# Patient Record
Sex: Female | Born: 1935 | Race: White | Hispanic: No | Marital: Married | Smoking: Never smoker
Health system: Southern US, Community
[De-identification: ages and names within clinical notes are randomized; demographics above are authoritative.]

## PROBLEM LIST (undated history)

## (undated) DIAGNOSIS — Z860100 Personal history of colon polyps, unspecified: Secondary | ICD-10-CM

## (undated) DIAGNOSIS — E559 Vitamin D deficiency, unspecified: Secondary | ICD-10-CM

## (undated) DIAGNOSIS — B269 Mumps without complication: Secondary | ICD-10-CM

## (undated) DIAGNOSIS — R252 Cramp and spasm: Secondary | ICD-10-CM

## (undated) DIAGNOSIS — B019 Varicella without complication: Secondary | ICD-10-CM

## (undated) DIAGNOSIS — I10 Essential (primary) hypertension: Secondary | ICD-10-CM

## (undated) DIAGNOSIS — E119 Type 2 diabetes mellitus without complications: Secondary | ICD-10-CM

## (undated) DIAGNOSIS — E039 Hypothyroidism, unspecified: Secondary | ICD-10-CM

## (undated) DIAGNOSIS — L989 Disorder of the skin and subcutaneous tissue, unspecified: Secondary | ICD-10-CM

## (undated) DIAGNOSIS — M810 Age-related osteoporosis without current pathological fracture: Secondary | ICD-10-CM

## (undated) DIAGNOSIS — E7800 Pure hypercholesterolemia, unspecified: Secondary | ICD-10-CM

## (undated) DIAGNOSIS — B059 Measles without complication: Secondary | ICD-10-CM

## (undated) HISTORY — DX: Personal history of colon polyps, unspecified: Z86.0100

## (undated) HISTORY — PX: NECK SURGERY: SHX720

## (undated) HISTORY — DX: Type 2 diabetes mellitus without complications: E11.9

## (undated) HISTORY — DX: Hypercalcemia: E83.52

## (undated) HISTORY — PX: THYROID CYST EXCISION: SHX2511

## (undated) HISTORY — DX: Vitamin D deficiency, unspecified: E55.9

## (undated) HISTORY — DX: Measles without complication: B05.9

## (undated) HISTORY — DX: Cramp and spasm: R25.2

## (undated) HISTORY — DX: Varicella without complication: B01.9

## (undated) HISTORY — DX: Essential (primary) hypertension: I10

## (undated) HISTORY — PX: CHOLECYSTECTOMY: SHX55

## (undated) HISTORY — DX: Age-related osteoporosis without current pathological fracture: M81.0

## (undated) HISTORY — DX: Personal history of colonic polyps: Z86.010

## (undated) HISTORY — DX: Mumps without complication: B26.9

## (undated) HISTORY — DX: Hypothyroidism, unspecified: E03.9

## (undated) HISTORY — DX: Disorder of the skin and subcutaneous tissue, unspecified: L98.9

## (undated) HISTORY — DX: Pure hypercholesterolemia, unspecified: E78.00

---

## 1978-03-04 HISTORY — PX: ABDOMINAL HYSTERECTOMY: SHX81

## 1979-03-05 HISTORY — PX: HERNIA REPAIR: SHX51

## 2004-04-24 ENCOUNTER — Ambulatory Visit: Payer: Self-pay

## 2004-11-25 ENCOUNTER — Emergency Department: Payer: Self-pay | Admitting: Emergency Medicine

## 2005-06-27 ENCOUNTER — Ambulatory Visit: Payer: Self-pay

## 2006-07-24 ENCOUNTER — Ambulatory Visit: Payer: Self-pay

## 2007-06-02 ENCOUNTER — Ambulatory Visit: Payer: Self-pay | Admitting: Ophthalmology

## 2007-06-02 ENCOUNTER — Other Ambulatory Visit: Payer: Self-pay

## 2007-06-15 ENCOUNTER — Ambulatory Visit: Payer: Self-pay | Admitting: Ophthalmology

## 2007-06-30 LAB — HM PAP SMEAR: HM Pap smear: NORMAL

## 2007-07-27 ENCOUNTER — Ambulatory Visit: Payer: Self-pay | Admitting: Gastroenterology

## 2007-07-27 LAB — HM COLONOSCOPY

## 2007-07-28 ENCOUNTER — Ambulatory Visit: Payer: Self-pay | Admitting: Family Medicine

## 2007-09-24 ENCOUNTER — Ambulatory Visit: Payer: Self-pay | Admitting: Family Medicine

## 2008-06-08 ENCOUNTER — Ambulatory Visit: Payer: Self-pay | Admitting: Family Medicine

## 2008-07-29 ENCOUNTER — Ambulatory Visit: Payer: Self-pay | Admitting: Family Medicine

## 2010-01-19 ENCOUNTER — Encounter: Admission: RE | Admit: 2010-01-19 | Discharge: 2010-01-19 | Payer: Self-pay | Admitting: Family Medicine

## 2010-02-01 HISTORY — PX: OTHER SURGICAL HISTORY: SHX169

## 2010-02-28 ENCOUNTER — Encounter
Admission: RE | Admit: 2010-02-28 | Discharge: 2010-02-28 | Payer: Self-pay | Source: Home / Self Care | Attending: General Surgery | Admitting: General Surgery

## 2010-03-01 ENCOUNTER — Ambulatory Visit
Admission: RE | Admit: 2010-03-01 | Discharge: 2010-03-01 | Payer: Self-pay | Source: Home / Self Care | Attending: General Surgery | Admitting: General Surgery

## 2010-05-14 LAB — DIFFERENTIAL
Eosinophils Absolute: 0.1 10*3/uL (ref 0.0–0.7)
Eosinophils Relative: 1 % (ref 0–5)
Lymphocytes Relative: 30 % (ref 12–46)
Lymphs Abs: 3 10*3/uL (ref 0.7–4.0)
Monocytes Absolute: 0.9 10*3/uL (ref 0.1–1.0)
Monocytes Relative: 9 % (ref 3–12)

## 2010-05-14 LAB — GLUCOSE, CAPILLARY: Glucose-Capillary: 114 mg/dL — ABNORMAL HIGH (ref 70–99)

## 2010-05-14 LAB — CBC
HCT: 44.1 % (ref 36.0–46.0)
Hemoglobin: 14.5 g/dL (ref 12.0–15.0)
MCH: 28.9 pg (ref 26.0–34.0)
MCV: 87.8 fL (ref 78.0–100.0)
Platelets: 239 10*3/uL (ref 150–400)
RBC: 5.02 MIL/uL (ref 3.87–5.11)
WBC: 10 10*3/uL (ref 4.0–10.5)

## 2010-05-14 LAB — BASIC METABOLIC PANEL
CO2: 30 mEq/L (ref 19–32)
Chloride: 104 mEq/L (ref 96–112)
Creatinine, Ser: 0.97 mg/dL (ref 0.4–1.2)
GFR calc Af Amer: >60 mL/min (ref >60)
Potassium: 4.5 mEq/L (ref 3.5–5.1)
Sodium: 140 mEq/L (ref 135–145)

## 2010-08-29 ENCOUNTER — Ambulatory Visit: Payer: Self-pay | Admitting: Family Medicine

## 2010-12-25 ENCOUNTER — Ambulatory Visit: Payer: Self-pay | Admitting: Family Medicine

## 2011-01-30 ENCOUNTER — Ambulatory Visit: Payer: Self-pay | Admitting: Ophthalmology

## 2011-01-30 DIAGNOSIS — I10 Essential (primary) hypertension: Secondary | ICD-10-CM

## 2011-02-02 HISTORY — PX: OTHER SURGICAL HISTORY: SHX169

## 2011-02-11 ENCOUNTER — Ambulatory Visit: Payer: Self-pay | Admitting: Ophthalmology

## 2011-02-14 ENCOUNTER — Ambulatory Visit: Payer: Self-pay | Admitting: Family Medicine

## 2011-03-02 ENCOUNTER — Ambulatory Visit: Admission: RE | Admit: 2011-03-02 | Payer: Self-pay | Source: Home / Self Care | Admitting: General Surgery

## 2011-07-09 LAB — LIPID PANEL
HDL: 44 mg/dL (ref 35–70)
LDL Cholesterol: 76 mg/dL
Triglycerides: 230 mg/dL — AB (ref 40–160)

## 2011-11-14 ENCOUNTER — Encounter: Payer: Self-pay | Admitting: *Deleted

## 2011-11-15 ENCOUNTER — Encounter: Payer: Self-pay | Admitting: *Deleted

## 2011-11-18 ENCOUNTER — Ambulatory Visit (INDEPENDENT_AMBULATORY_CARE_PROVIDER_SITE_OTHER): Payer: Medicare Other | Admitting: Family Medicine

## 2011-11-18 ENCOUNTER — Encounter: Payer: Self-pay | Admitting: Family Medicine

## 2011-11-18 VITALS — BP 164/71 | HR 75 | Temp 98.0°F | Resp 16 | Ht 63.0 in | Wt 206.0 lb

## 2011-11-18 DIAGNOSIS — E119 Type 2 diabetes mellitus without complications: Secondary | ICD-10-CM | POA: Insufficient documentation

## 2011-11-18 DIAGNOSIS — E7800 Pure hypercholesterolemia, unspecified: Secondary | ICD-10-CM | POA: Insufficient documentation

## 2011-11-18 DIAGNOSIS — R059 Cough, unspecified: Secondary | ICD-10-CM

## 2011-11-18 DIAGNOSIS — R05 Cough: Secondary | ICD-10-CM

## 2011-11-18 DIAGNOSIS — I10 Essential (primary) hypertension: Secondary | ICD-10-CM

## 2011-11-18 DIAGNOSIS — M8580 Other specified disorders of bone density and structure, unspecified site: Secondary | ICD-10-CM | POA: Insufficient documentation

## 2011-11-18 DIAGNOSIS — Z23 Encounter for immunization: Secondary | ICD-10-CM

## 2011-11-18 DIAGNOSIS — K219 Gastro-esophageal reflux disease without esophagitis: Secondary | ICD-10-CM

## 2011-11-18 LAB — LIPID PANEL
Cholesterol: 160 mg/dL (ref 0–200)
HDL: 44 mg/dL (ref >39)
Triglycerides: 197 mg/dL — ABNORMAL HIGH (ref <150)

## 2011-11-18 LAB — CBC WITH DIFFERENTIAL/PLATELET
Basophils Relative: 0 % (ref 0–1)
Eosinophils Absolute: 0.1 10*3/uL (ref 0.0–0.7)
Eosinophils Relative: 1 % (ref 0–5)
Lymphs Abs: 2.4 10*3/uL (ref 0.7–4.0)
MCH: 29 pg (ref 26.0–34.0)
MCHC: 34 g/dL (ref 30.0–36.0)
MCV: 85.4 fL (ref 78.0–100.0)
Monocytes Relative: 7 % (ref 3–12)
Platelets: 288 10*3/uL (ref 150–400)
RBC: 4.93 MIL/uL (ref 3.87–5.11)

## 2011-11-18 LAB — COMPREHENSIVE METABOLIC PANEL
BUN: 18 mg/dL (ref 6–23)
CO2: 29 mEq/L (ref 19–32)
Calcium: 10 mg/dL (ref 8.4–10.5)
Chloride: 101 mEq/L (ref 96–112)
Creat: 0.65 mg/dL (ref 0.50–1.10)
Total Bilirubin: 0.6 mg/dL (ref 0.3–1.2)

## 2011-11-18 LAB — POCT GLYCOSYLATED HEMOGLOBIN (HGB A1C): Hemoglobin A1C: 6.4

## 2011-11-18 MED ORDER — METOPROLOL SUCCINATE ER 25 MG PO TB24: 25.0000 mg | ORAL_TABLET | Freq: Every day | ORAL | Status: DC

## 2011-11-18 MED ORDER — LEVOTHYROXINE SODIUM 50 MCG PO TABS: 50.0000 ug | ORAL_TABLET | Freq: Every day | ORAL | Status: DC

## 2011-11-18 MED ORDER — OMEPRAZOLE 20 MG PO CPDR: 20.0000 mg | DELAYED_RELEASE_CAPSULE | Freq: Every day | ORAL | Status: DC

## 2011-11-18 NOTE — Progress Notes (Signed)
Subjective:    Patient ID: Kara Rogers, female    DOB: [birth year], 78 y.o.   MRN: [id number]  HPIThis 78 y.o. female presents for evaluation of the following:  1.  HTN:  Three month follow-up for HTN.  Management changes at last visit include stopping Enalapril and Maxzide and starting Lisinopril/HCTZ 20/25 one daily.  Home blood pressure readings running 140/80.  Denies chest pain, palpitations, shortness of breath, leg swelling.  Has suffered with a cough with clear mucous production for past month; +FB sensation in throat.  2.  Hyperlipidemia:  Three month follow-up for hyerlipidemia.  No changes to management made at last visit.  Compliance with medication; good tolerance to medication; good symptoms control.  Denies chest pain, palpitations, shortness of breath, leg swelling.  Denies myalgias.  3.  DMII:  Three month follow-up for DMII; no changes to management at last visit.  Good tolerance to medication; good symptoms control; good compliance with medication.  Fasting sugars running 126 fasting, 132 post-prandial.  Denies polydipsia, polyuria, skin lesions, numbness or tingling of extremities.  4. Osteopenia: three month follow-up of osteopenia; management made at last visit included starting Fosamax.   tolerating Fosamax with side effects.  Taking medication  on Monday.  5.  Leg cramps:  Better.   6.  Flu vaccine: agreeable.    7. Cough: onset one month ago; no URI symptoms with onset.  No sputum production but will cough up clear mucous from throat; +FB sensation in throat; +worsening heartburn over past several months.  No recent allergic rhinitis symptoms.  Also started Lisinopril three months ago. Cough worse in mornings and late in evenings.  No SOB associated with cough.   Review of Systems  Constitutional: Negative for fever, chills, diaphoresis and fatigue.  HENT: Negative for congestion, rhinorrhea, sneezing and postnasal drip.   Respiratory: Positive for cough. Negative for  shortness of breath and wheezing.   Cardiovascular: Negative for chest pain, palpitations and leg swelling.  Gastrointestinal: Negative for nausea, vomiting, abdominal pain and diarrhea.        Past Medical History  Diagnosis Date  . Essential hypertension, benign   . Pure hypercholesterolemia   . Type II or unspecified type diabetes mellitus without mention of complication, not stated as uncontrolled   . Osteoporosis   . Leg cramps   . Unspecified disorder of skin and subcutaneous tissue   . Diarrhea   . Osteopenia   . Unspecified hypothyroidism   . Unspecified vitamin D deficiency   . Personal history of colonic polyps   . Hypercalcemia   . Chicken pox   . Measles   . Mumps     Past Surgical History  Procedure Date  . Neck surgery     L neck cyst resection  . Thyroid cyst excision   . Abdominal hysterectomy 1980  . Hernia repair 1981  . Gallbladder surgery 1996  . Cataract surgery 02/2011    Left; Right 2009  . Breast ductal resection 02/2010    Left-[place]. Resection of thickened breast duct. Symptoms: L breast drainage hemocult +    Prior to Admission medications   Medication Sig Start Date End Date Taking? Authorizing Provider  alendronate (FOSAMAX) 70 MG tablet Take 70 mg by mouth every 7 (seven) days. Take with a full glass of water on an empty stomach.   Yes Historical Provider, MD  Ascorbic Acid (VITAMIN C) 1000 MG tablet Take 1,000 mg by mouth daily.   Yes Historical Provider, MD  aspirin  81 MG tablet Take 81 mg by mouth daily.   Yes Historical Provider, MD  cholecalciferol (VITAMIN D) 1000 UNITS tablet Take 1,000 Units by mouth daily.   Yes Historical Provider, MD  cholecalciferol (VITAMIN D) 400 UNITS TABS Take by mouth.   Yes Historical Provider, MD  levothyroxine (SYNTHROID, LEVOTHROID) 50 MCG tablet Take 1 tablet (50 mcg total) by mouth daily. 11/18/11  Yes Ethelda Chick, MD  lisinopril-hydrochlorothiazide (PRINZIDE,ZESTORETIC) 20-25 MG per tablet  Take 1 tablet by mouth daily.   Yes Historical Provider, MD  metFORMIN (GLUCOPHAGE) 500 MG tablet Take 500 mg by mouth 3 (three) times daily.   Yes Historical Provider, MD  metoprolol succinate (TOPROL-XL) 25 MG 24 hr tablet Take 1 tablet (25 mg total) by mouth daily. 11/18/11  Yes Ethelda Chick, MD  Multiple Vitamin (MULTIVITAMINS PO) Take by mouth.   Yes Historical Provider, MD  simvastatin (ZOCOR) 40 MG tablet Take 40 mg by mouth every evening.   Yes Historical Provider, MD  Vitamin E (NATURAL VITAMIN E) 400 UNITS TABS Take by mouth daily.   Yes Historical Provider, MD  enalapril (VASOTEC) 20 MG tablet Take 20 mg by mouth daily.    Historical Provider, MD  omeprazole (PRILOSEC) 20 MG capsule Take 1 capsule (20 mg total) by mouth daily. 11/18/11   Ethelda Chick, MD  triamterene-hydrochlorothiazide (DYAZIDE) 37.5-25 MG per capsule Take 1 capsule by mouth daily.    Historical Provider, MD    No Known Allergies  History   Social History  . Marital Status: Married    Spouse Name: N/A    Number of Children: 3  . Years of Education: 12   Occupational History  . retired     2006-07-20   Social History Main Topics  . Smoking status: Never Smoker   . Smokeless tobacco: Not on file  . Alcohol Use: No  . Drug Use: No  . Sexually Active: Not on file   Other Topics Concern  . Not on file   Social History Narrative   Consumes a minimal amount of caffeine.Smoke alarms and carbon monoxide detector in the home. Always uses seat belts.Organ donor;No. Guns in the home: No unsecured guns in the home. 3 children (53,51,41) 5 grandchildren, 5 gg. Married since [employer], first husband died Jul 20, 2002 (48 years of marriage). Happily married;no abuse. Exercise: Walking 45-60 minutes. 3 x week; exercise program with church. Patient DOES have Living Will; FULL CODE but no prolonged measures. Lives with spouse. Independent with all ADL's; drives.    Family History  Problem Relation Age of Onset  .  Cancer Father   . Seizures Sister     Epilepsy  . Cancer Sister     breast  . Cirrhosis Brother     deceased  . Osteoporosis Mother   . Arthritis Mother   . Diabetes Sister     insulin dependent    Objective:   Physical Exam  Nursing note and vitals reviewed. Constitutional: She is oriented to person, place, and time. She appears well-developed and well-nourished. No distress.  HENT:  Head: Normocephalic and atraumatic.  Nose: Nose normal.  Mouth/Throat: Oropharynx is clear and moist.  Eyes: Conjunctivae normal and EOM are normal. Pupils are equal, round, and reactive to light.  Neck: Normal range of motion. Neck supple. No thyromegaly present.  Cardiovascular: Normal rate, regular rhythm, normal heart sounds and intact distal pulses.   No murmur heard. Pulmonary/Chest: Effort normal and breath sounds normal. No respiratory distress.  She has no wheezes. She has no rales.  Abdominal: Soft. Bowel sounds are normal.  Lymphadenopathy:    She has no cervical adenopathy.  Neurological: She is alert and oriented to person, place, and time.  Skin: Skin is warm and dry. She is not diaphoretic.  Psychiatric: She has a normal mood and affect. Her behavior is normal. Judgment and thought content normal.     FLU VACCINE ADMINISTERED IN OFFICE.     Assessment & Plan:   1. Type II or unspecified type diabetes mellitus without mention of complication, not stated as uncontrolled  POCT glycosylated hemoglobin (Hb A1C)  2. Pure hypercholesterolemia  Lipid panel  3. Essential hypertension, benign  CBC with Differential, CK, Comprehensive metabolic panel, metoprolol succinate (TOPROL-XL) 25 MG 24 hr tablet  4. Need for influenza vaccination  Flu vaccine greater than or equal to 3yo preservative free IM  5. Cough  omeprazole (PRILOSEC) 20 MG capsule  6. GERD (gastroesophageal reflux disease)    7. Osteopenia  1.  DMII:  Controlled; no change in medication at this time; obtain labs.  S/p flu  vaccine 2.  Hypercholesterolemia: controlled; no change in medications; obtain labs. 3.  Hypertension: uncontrolled yet multiple stressors this week and new office experience; if persistently elevated at next visit, increase Metoprolol to 50mg  daily. 4.  Cough: New.  Associated with mucous clear production, FB sensation in throat, worsening heartburn.  Will treat with Omeprazole for GERD induced cough; if no improvement with Omeprazole, will hold Lisinopril. 5. GERD: new.  Rx for Omeprazole provided.  Dietary modification. 6.  Leg cramps: improved.   7. Osteopenia: stable; tolerating Fosamax.

## 2011-11-19 ENCOUNTER — Encounter: Payer: Self-pay | Admitting: Family Medicine

## 2011-11-19 NOTE — Progress Notes (Signed)
Reviewed and agree.

## 2012-02-19 ENCOUNTER — Encounter: Payer: Self-pay | Admitting: Family Medicine

## 2012-02-19 ENCOUNTER — Ambulatory Visit (INDEPENDENT_AMBULATORY_CARE_PROVIDER_SITE_OTHER): Payer: Medicare Other | Admitting: Family Medicine

## 2012-02-19 VITALS — BP 153/68 | HR 72 | Temp 97.7°F | Resp 18 | Ht 64.0 in | Wt 208.0 lb

## 2012-02-19 DIAGNOSIS — Z0000 Encounter for general adult medical examination without abnormal findings: Secondary | ICD-10-CM

## 2012-02-19 DIAGNOSIS — E119 Type 2 diabetes mellitus without complications: Secondary | ICD-10-CM

## 2012-02-19 DIAGNOSIS — N3941 Urge incontinence: Secondary | ICD-10-CM

## 2012-02-19 DIAGNOSIS — I10 Essential (primary) hypertension: Secondary | ICD-10-CM

## 2012-02-19 DIAGNOSIS — Z01419 Encounter for gynecological examination (general) (routine) without abnormal findings: Secondary | ICD-10-CM

## 2012-02-19 DIAGNOSIS — M8580 Other specified disorders of bone density and structure, unspecified site: Secondary | ICD-10-CM

## 2012-02-19 DIAGNOSIS — E7800 Pure hypercholesterolemia, unspecified: Secondary | ICD-10-CM

## 2012-02-19 DIAGNOSIS — E039 Hypothyroidism, unspecified: Secondary | ICD-10-CM

## 2012-02-19 LAB — COMPREHENSIVE METABOLIC PANEL
Albumin: 4.3 g/dL (ref 3.5–5.2)
CO2: 28 mEq/L (ref 19–32)
Glucose, Bld: 116 mg/dL — ABNORMAL HIGH (ref 70–99)
Sodium: 139 mEq/L (ref 135–145)
Total Bilirubin: 0.9 mg/dL (ref 0.3–1.2)
Total Protein: 6.9 g/dL (ref 6.0–8.3)

## 2012-02-19 LAB — CBC WITH DIFFERENTIAL/PLATELET
Basophils Relative: 0 % (ref 0–1)
HCT: 41.5 % (ref 36.0–46.0)
Hemoglobin: 14.2 g/dL (ref 12.0–15.0)
MCH: 28.9 pg (ref 26.0–34.0)
MCHC: 34.2 g/dL (ref 30.0–36.0)
MCV: 84.5 fL (ref 78.0–100.0)
Monocytes Absolute: 0.6 10*3/uL (ref 0.1–1.0)
Monocytes Relative: 7 % (ref 3–12)
Neutro Abs: 5.3 10*3/uL (ref 1.7–7.7)

## 2012-02-19 LAB — POCT URINALYSIS DIPSTICK
Bilirubin, UA: NEGATIVE
Blood, UA: NEGATIVE
Ketones, UA: NEGATIVE
Leukocytes, UA: NEGATIVE
Spec Grav, UA: 1.025
pH, UA: 5.5

## 2012-02-19 MED ORDER — METOPROLOL SUCCINATE ER 50 MG PO TB24: 50.0000 mg | ORAL_TABLET | Freq: Every day | ORAL | Status: DC

## 2012-02-19 MED ORDER — SOLIFENACIN SUCCINATE 5 MG PO TABS: 5.0000 mg | ORAL_TABLET | Freq: Every day | ORAL | Status: DC

## 2012-02-19 NOTE — Progress Notes (Signed)
[address]   [place], [place]  [address]   [phone]  Subjective:    Patient ID: Kara Rogers, female    DOB: February 02, 1932, 78 y.o.   MRN: [id number]  HPIThis 78 y.o. female presents for Annual Wellness Exam.  Last CPE 02/14/2011.  Pap smear years ago.  Mammogram 02/2011.  Colonoscopy 2008; repeat in 5 years; [facility]; has not been contacted yet to reschedule colonoscopy.  Bone density 2013.  Pneumovax 2008.  Zostavax never; +herpes zoster infection in past. TDAP 02/2011.  Eye exam 2013; Dingeldein; +glasses; s/p cataract surgery 02/2011.  Dental exam scheduled 03/2012.  .  1.  Hypertension:  Blood pressure running 140s at home.  Compliance with medication; good tolerance to medication; good symptom control.  Denies CP/palp/SOB/leg swelling.  2. DMII:  Sugars running 140-160; 130 fasting.  Compliance with medication; good tolerance to medication; good symptom control.  Denies polyuria, polydipsia.  3.  Hyperlipidemia: three month follow-up; no changes to management made at last visit; denies HA/dizziness/focal weakness/paresthesias.  4.  Hypothyroidism:  One year follow-up; reports good compliance with medication; good tolerance to medication; good symptom control.   5.  Osteopenia: tolerating Fosamax without side effects. S/p bone density 2013.   6. GERD: completed one month rx and then stopped.  Rare indigestion.  No further cough.  No n/v/d/c; no abdominal pain.  No black or bloody stools  7. Cough:  Resolved.  Hung around for several more weeks after last visit and finally resolved.    8. Urinary incontinence: worsening; wearing a pad all day long; leaking more; very bothersome to patient.  No previous trial of medication and interested.  9. Grief reaction: baby sister diagnosed with lung cancer in past month; giving palliative radiation to control pain.     Review of Systems  Constitutional: Negative for fever, chills, diaphoresis, activity change, appetite change, fatigue and  unexpected weight change.  HENT: Negative for hearing loss, ear pain, nosebleeds, congestion, facial swelling, rhinorrhea, sneezing, neck pain, neck stiffness, postnasal drip, tinnitus and ear discharge.   Eyes: Negative for photophobia, pain, discharge, redness, itching and visual disturbance.  Respiratory: Negative for apnea, cough, choking, chest tightness, shortness of breath, wheezing and stridor.   Cardiovascular: Negative for chest pain, palpitations and leg swelling.  Gastrointestinal: Negative for abdominal distention.  Genitourinary: Negative for dysuria, urgency, frequency, hematuria, flank pain, decreased urine volume, enuresis, difficulty urinating, genital sores, pelvic pain and dyspareunia.        Past Medical History  Diagnosis Date  . Essential hypertension, benign   . Pure hypercholesterolemia   . Type II or unspecified type diabetes mellitus without mention of complication, not stated as uncontrolled   . Osteoporosis   . Leg cramps   . Unspecified disorder of skin and subcutaneous tissue   . Diarrhea   . Osteopenia   . Unspecified hypothyroidism   . Unspecified vitamin D deficiency   . Personal history of colonic polyps   . Hypercalcemia   . Chicken pox   . Measles   . Mumps     Past Surgical History  Procedure Date  . Neck surgery     L neck cyst resection  . Thyroid cyst excision   . Abdominal hysterectomy 1980  . Hernia repair 1981  . Gallbladder surgery 1996  . Cataract surgery 02/2011    Left; Right 2009  . Breast ductal resection 02/2010    Left-[place]. Resection of thickened breast duct. Symptoms: L breast drainage hemocult +  Prior to A//dmission medications   Medication Sig Start Date End Date Taking? Authorizing Provider  alendronate (FOSAMAX) 70 MG tablet Take 70 mg by mouth every 7 (seven) days. Take with a full glass of water on an empty stomach.   Yes Historical Provider, MD  Ascorbic Acid (VITAMIN C) 1000 MG tablet Take 1,000 mg by  mouth daily.   Yes Historical Provider, MD  aspirin 81 MG tablet Take 81 mg by mouth daily.   Yes Historical Provider, MD  cholecalciferol (VITAMIN D) 1000 UNITS tablet Take 1,000 Units by mouth daily.   Yes Historical Provider, MD  levothyroxine (SYNTHROID, LEVOTHROID) 50 MCG tablet Take 1 tablet (50 mcg total) by mouth daily. 11/18/11  Yes Ethelda Chick, MD  metFORMIN (GLUCOPHAGE) 500 MG tablet Take 500 mg by mouth 3 (three) times daily.   Yes Historical Provider, MD  metoprolol succinate (TOPROL-XL) 25 MG 24 hr tablet Take 1 tablet (25 mg total) by mouth daily. 11/18/11  Yes Ethelda Chick, MD  Multiple Vitamin (MULTIVITAMINS PO) Take by mouth.   Yes Historical Provider, MD  omeprazole (PRILOSEC) 20 MG capsule Take 1 capsule (20 mg total) by mouth daily. 11/18/11  Yes Ethelda Chick, MD  simvastatin (ZOCOR) 40 MG tablet Take 40 mg by mouth every evening.   Yes Historical Provider, MD  cholecalciferol (VITAMIN D) 400 UNITS TABS Take by mouth.    Historical Provider, MD  enalapril (VASOTEC) 20 MG tablet Take 20 mg by mouth daily.    Historical Provider, MD  lisinopril-hydrochlorothiazide (PRINZIDE,ZESTORETIC) 20-25 MG per tablet Take 1 tablet by mouth daily.    Historical Provider, MD  triamterene-hydrochlorothiazide (DYAZIDE) 37.5-25 MG per capsule Take 1 capsule by mouth daily.    Historical Provider, MD  Vitamin E (NATURAL VITAMIN E) 400 UNITS TABS Take by mouth daily.    Historical Provider, MD    No Known Allergies  History   Social History  . Marital Status: Married    Spouse Name: N/A    Number of Children: 3  . Years of Education: 12   Occupational History  . retired     2006-07-31   Social History Main Topics  . Smoking status: Never Smoker   . Smokeless tobacco: Not on file  . Alcohol Use: No  . Drug Use: No  . Sexually Active: Not on file   Other Topics Concern  . Not on file   Social History Narrative   Consumes a minimal amount of caffeine.Smoke alarms and carbon  monoxide detector in the home. Always uses seat belts.Organ donor;No. Guns in the home: No unsecured guns in the home. 3 children (53,51,41) 5 grandchildren, 5 gg. Married since [employer], first husband died 31-Jul-2002 (25 years of marriage). Happily married;no abuse. Exercise: Walking 45-60 minutes. 3 x week; exercise program with church. Patient DOES have Living Will; FULL CODE but no prolonged measures. Lives with spouse. Independent with all ADL's; drives.    Family History  Problem Relation Age of Onset  . Cancer Father   . Seizures Sister     Epilepsy  . Cancer Sister     breast  . Cirrhosis Brother     deceased  . Osteoporosis Mother   . Arthritis Mother   . Diabetes Sister     insulin dependent    Objective:   Physical Exam  Nursing note and vitals reviewed. Constitutional: She is oriented to person, place, and time. She appears well-developed and well-nourished. No distress.  HENT:  Head:  Normocephalic and atraumatic.  Right Ear: External ear normal.  Left Ear: External ear normal.  Nose: Nose normal.  Mouth/Throat: Oropharynx is clear and moist.  Eyes: Conjunctivae normal and EOM are normal. Pupils are equal, round, and reactive to light.  Neck: Normal range of motion. Neck supple. No JVD present. No thyromegaly present.  Cardiovascular: Normal rate, regular rhythm, normal heart sounds and intact distal pulses.  Exam reveals no gallop and no friction rub.   No murmur heard. Pulmonary/Chest: Effort normal and breath sounds normal. She has no wheezes. She has no rales.  Abdominal: Soft. Bowel sounds are normal. She exhibits no mass. There is no tenderness. There is no rebound and no guarding.  Genitourinary: Vagina normal. There is no rash, tenderness or lesion on the right labia. There is no rash, tenderness or lesion on the left labia. Right adnexum displays no mass, no tenderness and no fullness. Left adnexum displays no mass, no tenderness and no fullness. No  vaginal discharge found.  Musculoskeletal:       Right shoulder: Normal.       Left shoulder: Normal.       Cervical back: Normal.  Lymphadenopathy:    She has no cervical adenopathy.  Neurological: She is alert and oriented to person, place, and time. She has normal reflexes. No cranial nerve deficit or sensory deficit. She exhibits normal muscle tone. Coordination normal.       MONOFILAMENT INTACT B FEET.  Skin: Skin is warm and dry. No rash noted. She is not diaphoretic. No erythema.  Psychiatric: She has a normal mood and affect. Her behavior is normal. Judgment and thought content normal.    EKG:  NSR.     Assessment & Plan:   1. Type II or unspecified type diabetes mellitus without mention of complication, not stated as uncontrolled  Comprehensive metabolic panel, Hemoglobin A1c, Microalbumin, urine  2. Pure hypercholesterolemia  CK  3. Essential hypertension, benign  POCT urinalysis dipstick, EKG 12-Lead, metoprolol succinate (TOPROL-XL) 50 MG 24 hr tablet  4. Unspecified hypothyroidism  CBC with Differential, TSH  5. Routine general medical examination at a health care facility    6. Routine gynecological examination  MM Digital Screening  7. Osteopenia

## 2012-02-19 NOTE — Patient Instructions (Addendum)
1. Type II or unspecified type diabetes mellitus without mention of complication, not stated as uncontrolled  Comprehensive metabolic panel, Hemoglobin A1c, Microalbumin, urine  2. Pure hypercholesterolemia  CK  3. Essential hypertension, benign  POCT urinalysis dipstick, EKG 12-Lead, metoprolol succinate (TOPROL-XL) 50 MG 24 hr tablet  4. Unspecified hypothyroidism  CBC with Differential, TSH  5. Routine general medical examination at a health care facility    6. Routine gynecological examination  MM Digital Screening  7. Osteopenia

## 2012-02-20 LAB — HEMOGLOBIN A1C
Hgb A1c MFr Bld: 6.6 % — ABNORMAL HIGH (ref <5.7)
Mean Plasma Glucose: 143 mg/dL — ABNORMAL HIGH (ref <117)

## 2012-02-21 ENCOUNTER — Encounter: Payer: Self-pay | Admitting: Family Medicine

## 2012-02-21 DIAGNOSIS — N3941 Urge incontinence: Secondary | ICD-10-CM | POA: Insufficient documentation

## 2012-02-21 DIAGNOSIS — Z01419 Encounter for gynecological examination (general) (routine) without abnormal findings: Secondary | ICD-10-CM | POA: Insufficient documentation

## 2012-02-21 DIAGNOSIS — Z0000 Encounter for general adult medical examination without abnormal findings: Secondary | ICD-10-CM | POA: Insufficient documentation

## 2012-02-21 DIAGNOSIS — E039 Hypothyroidism, unspecified: Secondary | ICD-10-CM | POA: Insufficient documentation

## 2012-02-21 NOTE — Assessment & Plan Note (Signed)
Controlled.  No changes to management. Obtain labs including urine microalbumin.

## 2012-02-21 NOTE — Assessment & Plan Note (Signed)
Controlled; no change in management; obtain labs. 

## 2012-02-21 NOTE — Assessment & Plan Note (Signed)
Uncontrolled; increase Metoprolol ER to 50mg  daily.  Obtain u/a, EKG.

## 2012-02-21 NOTE — Assessment & Plan Note (Signed)
Controlled; obtain labs; refill medication.

## 2012-02-21 NOTE — Assessment & Plan Note (Signed)
Controlled; continue Fosamax, regular exercise, calcium and vitamin D supplementation.

## 2012-02-21 NOTE — Assessment & Plan Note (Signed)
Anticipatory guidance --- weight loss, continued exercise.  No longer warrants pap smears due to age.  Refer for mammogram. Bone density UTD.  Immunizations UTD other than Zostavax; to check coverage with insurance.  Low fall risk.  No hearing loss. No evidence of depression despite recent stressors.  Independent with all ADLs.

## 2012-02-21 NOTE — Assessment & Plan Note (Signed)
Worsening; agreeable to trial of Vesicare 5mg  daily; discussed potential side effects including dry mouth, constipation.  Pt desires trial.

## 2012-02-28 NOTE — Progress Notes (Signed)
Reviewed and agree.

## 2012-03-11 ENCOUNTER — Ambulatory Visit: Payer: Self-pay | Admitting: Family Medicine

## 2012-03-17 ENCOUNTER — Telehealth: Payer: Self-pay | Admitting: *Deleted

## 2012-03-17 NOTE — Telephone Encounter (Signed)
Notified pt mammogram was normal

## 2012-05-19 ENCOUNTER — Ambulatory Visit: Payer: Medicare Other | Admitting: Family Medicine

## 2012-06-16 ENCOUNTER — Ambulatory Visit: Payer: Medicare Other

## 2012-06-16 ENCOUNTER — Ambulatory Visit (INDEPENDENT_AMBULATORY_CARE_PROVIDER_SITE_OTHER): Payer: Medicare Other | Admitting: Family Medicine

## 2012-06-16 ENCOUNTER — Encounter: Payer: Self-pay | Admitting: Family Medicine

## 2012-06-16 VITALS — BP 170/78 | HR 69 | Temp 98.4°F | Resp 16 | Ht 63.0 in | Wt 206.2 lb

## 2012-06-16 DIAGNOSIS — I10 Essential (primary) hypertension: Secondary | ICD-10-CM

## 2012-06-16 DIAGNOSIS — E1149 Type 2 diabetes mellitus with other diabetic neurological complication: Secondary | ICD-10-CM

## 2012-06-16 DIAGNOSIS — M25561 Pain in right knee: Secondary | ICD-10-CM

## 2012-06-16 DIAGNOSIS — E119 Type 2 diabetes mellitus without complications: Secondary | ICD-10-CM

## 2012-06-16 DIAGNOSIS — M25569 Pain in unspecified knee: Secondary | ICD-10-CM

## 2012-06-16 DIAGNOSIS — E7800 Pure hypercholesterolemia, unspecified: Secondary | ICD-10-CM

## 2012-06-16 LAB — CBC
Hemoglobin: 14.3 g/dL (ref 12.0–15.0)
MCH: 28.4 pg (ref 26.0–34.0)
RBC: 5.03 MIL/uL (ref 3.87–5.11)
WBC: 8.5 10*3/uL (ref 4.0–10.5)

## 2012-06-16 LAB — COMPREHENSIVE METABOLIC PANEL
ALT: 27 U/L (ref 0–35)
AST: 19 U/L (ref 0–37)
Albumin: 4.2 g/dL (ref 3.5–5.2)
CO2: 28 mEq/L (ref 19–32)
Calcium: 10.2 mg/dL (ref 8.4–10.5)
Chloride: 102 mEq/L (ref 96–112)
Potassium: 4.5 mEq/L (ref 3.5–5.3)
Sodium: 138 mEq/L (ref 135–145)
Total Protein: 6.8 g/dL (ref 6.0–8.3)

## 2012-06-16 LAB — LIPID PANEL
LDL Cholesterol: 62 mg/dL (ref 0–99)
VLDL: 40 mg/dL (ref 0–40)

## 2012-06-16 LAB — HEMOGLOBIN A1C: Hgb A1c MFr Bld: 6.2 % — ABNORMAL HIGH (ref <5.7)

## 2012-06-16 LAB — CK: Total CK: 45 U/L (ref 7–177)

## 2012-06-16 MED ORDER — LEVOTHYROXINE SODIUM 50 MCG PO TABS: 50.0000 ug | ORAL_TABLET | Freq: Every day | ORAL | Status: DC

## 2012-06-16 MED ORDER — METOPROLOL SUCCINATE ER 50 MG PO TB24: ORAL_TABLET | ORAL | Status: DC

## 2012-06-16 MED ORDER — SOLIFENACIN SUCCINATE 5 MG PO TABS: 5.0000 mg | ORAL_TABLET | Freq: Every day | ORAL | Status: DC

## 2012-06-16 MED ORDER — MELOXICAM 15 MG PO TABS: 15.0000 mg | ORAL_TABLET | Freq: Every day | ORAL | Status: DC

## 2012-06-16 MED ORDER — METFORMIN HCL 500 MG PO TABS: 500.0000 mg | ORAL_TABLET | Freq: Three times a day (TID) | ORAL | Status: DC

## 2012-06-16 MED ORDER — SIMVASTATIN 40 MG PO TABS: 40.0000 mg | ORAL_TABLET | Freq: Every evening | ORAL | Status: DC

## 2012-06-16 MED ORDER — LISINOPRIL-HYDROCHLOROTHIAZIDE 20-25 MG PO TABS: 1.0000 | ORAL_TABLET | Freq: Every day | ORAL | Status: DC

## 2012-06-16 MED ORDER — ALENDRONATE SODIUM 70 MG PO TABS: 70.0000 mg | ORAL_TABLET | ORAL | Status: DC

## 2012-06-16 NOTE — Patient Instructions (Addendum)
Lateral Collateral Knee Ligament Sprain with Phase I Rehab The lateral collateral ligament (LCL) of the knee helps hold the knee joint in proper alignment and prevents the bones from shifting out of alignment (displacing) toward the outside (laterally). Injury to the knee may cause a tear in the LCL ligament (sprain). The LCL is the least common ligament of the knee to be injured. Sprains may heal on their own, but they often result in a loose joint. Sprains are classified into three categories. Grade 1 sprains cause pain, but the tendon is not lengthened. Grade 2 sprains include a lengthened ligament, due to the ligament being stretched or partially ruptured. With grade 2 sprains there is still function, although the function may be decreased. Grade 3 sprains involve a complete tear of the tendon or muscle, and function is usually impaired. SYMPTOMS   Pain and tenderness on the outer side of the knee.  A "pop", tearing, or pulling sensation at the time of injury.  Bruising (contusion) at the site of injury within 48 hours of injury.  Knee stiffness.  Limping, often walking with the knee bent. CAUSES  An LCL sprain occurs when a force is placed on the ligament that is greater than it can handle. Common causes of injury include:  Direct hit (trauma) to the inner side of the knee, especially if the foot is planted on the ground.  Forceful pivoting of the body and leg, while the foot is planted on the ground. RISK INCREASES WITH:  Contact sports (football, rugby).  Sports that require pivoting or cutting (soccer).  Poor knee strength and flexibility.  Improper equipment use. PREVENTION   Warm up and stretch properly before activity.  Maintain physical fitness:  Strength, flexibility, and endurance.  Cardiovascular fitness.  Wear properly fitted protective equipment (correct length of cleats for surface).  Functional braces may be effective in preventing injury. PROGNOSIS  If  treated properly, LCL tears usually heal on their own. Sometimes, surgery is required. RELATED COMPLICATIONS   Frequently recurring symptoms, such as knee giving way, instability, and swelling.  Injury to other structures in the knee joint.  Meniscal cartilage, resulting in locking and swelling of the knee.  Articular cartilage, resulting in knee arthritis.  Other ligaments of the knee (commonly).  Injury to nerves, causing numbness of the outer leg, foot, and ankle and weakness or paralysis, with inability to raise the ankle, big toe, or lesser toes.  Knee stiffness (loss of knee motion). TREATMENT  Treatment first involves the use of ice and medicine, to reduce pain and inflammation. The use of strengthening and stretching exercises may help reduce pain with activity. These exercises may be performed at home, but referral to a therapist is often advised. You may be advised to walk with crutches, until you are able to walk without a limp. Your caregiver may provide you with a hinged knee brace to help regain a full range of motion, while also protecting the injured knee. For severe LCL injuries, or injuries that involve other ligaments of the knee, surgery is often advised. MEDICATION   If pain medicine is needed, nonsteroidal anti-inflammatory medicines (aspirin and ibuprofen), or other minor pain relievers (acetaminophen), are often advised.  Do not take pain medicine for 7 days before surgery.  Prescription pain relievers may be given, if your caregiver thinks they are needed. Use only as directed and only as much as you need. HEAT AND COLD  Cold treatment (icing) should be applied for 10 to 15 minutes   every 2 to 3 hours for inflammation and pain, and immediately after activity that aggravates your symptoms. Use ice packs or an ice massage.  Heat treatment may be used before performing stretching and strengthening activities prescribed by your caregiver, physical therapist, or  athletic trainer. Use a heat pack or a warm water soak. SEEK MEDICAL CARE IF:   Symptoms get worse or do not improve in 4 to 6 weeks, despite treatment.  New, unexplained symptoms develop. (Drugs used in treatment may produce side effects.) EXERCISES RANGE OF MOTION (ROM) AND STRETCHING EXERCISES - Lateral Collateral Knee Ligament Sprain Phase I These are some of the initial exercises that your physician, physical therapist or athletic trainer may have you perform to begin your rehabilitation. When you demonstrate gains in your flexibility and strength, your caregiver may progress you to Phase II exercises. As you perform these exercises, remember:   These initial exercises are intended to be gentle. They will help you restore motion without increasing any swelling.  Completing these exercises allows less painful movement and prepares you for the more aggressive strengthening exercises in Phase II.  An effective stretch should be held for at least 30 seconds.  A stretch should never be painful. You should only feel a gentle lengthening or release in the stretched tissue. RANGE OF MOTION - Knee Flexion, Active  Lie on your back with both knees straight. (If this causes back discomfort, bend your opposite knee, placing your foot flat on the floor.)  Slowly slide your heel back toward your buttocks until you feel a gentle stretch in the front of your knee or thigh.  Hold for __________ seconds. Slowly slide your heel back to the starting position. Repeat __________ times. Complete this exercise __________ times per day.  STRETCH - Knee Flexion, Supine  Lie on the floor with your right / left heel and foot lightly touching the wall. (Place both feet on the wall, if you do not use a door frame.)  Without using any effort, allow gravity to slide your foot down the wall slowly until you feel a gentle stretch in the front of your right / left knee.  Hold this stretch for __________ seconds.  Then return the leg to the starting position, using your healthy leg for help, if needed. Repeat __________ times. Complete this stretch __________ times per day.  RANGE OF MOTION - Knee Flexion and Extension, Active-Assisted  Sit on the edge of a table or chair with your thighs firmly supported. It may be helpful to place a folded towel under the end of your right / left thigh.  Flexion (bending): Place the ankle of your healthy leg on top of the other ankle. Use your healthy leg to gently bend your right / left knee until you feel a mild tension across the top of your knee.  Hold for __________ seconds.  Extension (straightening): Switch your ankles so your right / left leg is on top. Use your healthy leg to straighten your right / left knee until you feel a mild tension on the backside of your knee.  Hold for __________ seconds. Repeat __________ times. Complete this exercise __________ times per day. STRETCH - Knee Extension Sitting  Sit with yourright / left leg/heel propped on another chair, coffee table, or foot stool.  Allow your leg muscles to relax, letting gravity straighten out your knee.*  You should feel a stretch behind your right / left knee. Hold this position for __________ seconds. Repeat __________ times. Complete   this stretch __________ times per day.  *Your physician, physical therapist or athletic trainer may instruct you place a __________ weight on your thigh, just above your kneecap, to deepen the stretch.  STRENGTHENING EXERCISES Lateral Collateral Knee Ligament Sprain - Phase I These exercises may help you when beginning to rehabilitate your injury. They may resolve your symptoms with or without further involvement from your physician, physical therapist or athletic trainer. While completing these exercises, remember:   Muscles can gain both the endurance and the strength needed for everyday activities through controlled exercises.  Complete these exercises as  instructed by your physician, physical therapist or athletic trainer. Increase the resistance and repetitions only as guided.  In order to return to more demanding activities, you will likely need to progress to more challenging exercises. Your physician, physical therapist or athletic trainer will advance your exercises when your tissues show adequate healing and your muscles demonstrate increased strength. STRENGTH - Quadriceps, Isometrics  Lie on your back with your right / left leg extended and your opposite knee bent.  Gradually tense the muscles in the front of yourright / left thigh. You should see either your knee cap slide up toward your hip or increased dimpling just above the knee. This motion will push the back of the knee down toward the floor, mat, or bed on which you are lying.  Hold the muscle as tight as you can without increasing your pain for __________ seconds.  Relax the muscles slowly and completely between each repetition. Repeat __________ times. Complete this exercise __________ times per day.  STRENGTH - Quadriceps, Short Arcs   Lie on your back. Place a __________ inch towel roll under your right / left knee, so that the knee bends slightly.  Raise only your lower leg by tightening the muscles in the front of your thigh. Do not allow your thigh to rise.  Hold this position for __________ seconds. Repeat __________ times. Complete this exercise __________ times per day.  OPTIONAL ANKLE WEIGHTS: Begin with ____________________, but DO NOT exceed ____________________. Increase in 1 pound/0.5 kilogram increments. STRENGTH - Quadriceps, Straight Leg Raises  Quality counts! Watch for signs that the quadriceps muscle is working, to be sure you are strengthening the correct muscles and not "cheating" by substituting with healthier muscles.  Lay on your back with your right / left leg extended and your opposite knee bent.  Tense the muscles in the front of your right /  leftthigh. You should see either your knee cap slide up or increased dimpling just above the knee. Your thigh may even shake a bit.  Tighten these muscles even more and raise your leg 4 to 6 inches off the floor. Hold for __________ seconds.  Keeping these muscles tense, lower your leg.  Relax the muscles slowly and completely in between each repetition. Repeat __________ times. Complete this exercise __________ times per day.  STRENGTH - Hamstring, Isometrics   Lie on your back, on a firm surface.  Bend your right / left knee approximately __________ degrees.  Dig your heel into the surface as if you are trying to pull it toward your buttocks. Tighten the muscles in the back of your thighs to "dig" as hard as you can, without increasing any pain.  Hold this position for __________ seconds.  Release the tension gradually and allow your muscle to completely relax for __________ seconds in between each exercise. Repeat __________ times. Complete this exercise __________ times per day.  STRENGTH - Hamstring, Curls     Lay on your stomach with your legs extended. (If you lay on a bed, your feet may hang over the edge.)  Tighten the muscles in the back of your thigh to bend your right / left knee up to 90 degrees. Keep your hips flat on the bed.  Hold this position for __________ seconds.  Slowly lower your leg back to the starting position. Repeat __________ times. Complete this exercise __________ times per day.  OPTIONAL ANKLE WEIGHTS: Begin with ____________________, but DO NOT exceed ____________________. Increase in 1 pound/0.5 kilogram increments. Document Released: 02/18/2005 Document Revised: 05/13/2011 Document Reviewed: 06/02/2008 [employer] Patient Information 2013 [employer].  

## 2012-06-16 NOTE — Progress Notes (Signed)
[address]   [place], [place]  [address]   [phone]  Subjective:    Patient ID: Kara Rogers, female    DOB: 09/24/1931, 79 y.o.   MRN: [id number]  HPI This 79 y.o. female presents for four month follow-up:  1. HTN:  Home BP readings running 150-162/60-73.  Management at last visit included increasing Metoprolol ER to 50mg  daily.   2.  DMII:  Slightly high every morning.  Fasting sugars running 100-120.   Compliance with medication; good tolerance to medication; good symptom control.  3.  Hypercholesterolemia:  Four month follow-up; no changes to management made at last visit.   4. Urinary Incontinence:  Started Vesicare 5mg  daily; can walk instead of run to bathroom.  Decreased leaking.  50-60% improved.  Nocturia x1; previously nocturia x2.  No side effects to medication other than mild constipation.  Very happy with results.  5.  B knee pain: worsening; no change in exercise; still exercising 3 days per week; no swelling; R knee giving out.  +popping. Take Advil PM qhs (1) with good sleep through the night.    Review of Systems  Constitutional: Negative for fever, chills, diaphoresis, activity change, appetite change and fatigue.  Respiratory: Negative for cough, shortness of breath, wheezing and stridor.   Cardiovascular: Negative for chest pain, palpitations and leg swelling.  Gastrointestinal: Negative for nausea, vomiting, abdominal pain, diarrhea and constipation.  Endocrine: Negative for cold intolerance, heat intolerance, polydipsia, polyphagia and polyuria.  Genitourinary: Positive for urgency and frequency. Negative for dysuria, hematuria, flank pain, decreased urine volume, enuresis and pelvic pain.  Musculoskeletal: Positive for myalgias, joint swelling and arthralgias.  Skin: Negative for color change, pallor, rash and wound.  Neurological: Negative for dizziness, tremors, seizures, syncope, facial asymmetry, speech difficulty, weakness, light-headedness, numbness  and headaches.    Past Medical History  Diagnosis Date  . Essential hypertension, benign   . Pure hypercholesterolemia   . Type II or unspecified type diabetes mellitus without mention of complication, not stated as uncontrolled   . Osteoporosis   . Leg cramps   . Unspecified disorder of skin and subcutaneous tissue   . Diarrhea   . Osteopenia   . Unspecified hypothyroidism   . Unspecified vitamin D deficiency   . Personal history of colonic polyps   . Hypercalcemia   . Chicken pox   . Measles   . Mumps     Past Surgical History  Procedure Laterality Date  . Neck surgery      L neck cyst resection  . Thyroid cyst excision    . Hernia repair  1981  . Gallbladder surgery  1996  . Cataract surgery  02/2011    Left; Right 2009  . Breast ductal resection  02/2010    Left-[place]. Resection of thickened breast duct. Symptoms: L breast drainage hemocult +  . Abdominal hysterectomy  1980    R Ovary intact.  DUB.    Prior to Admission medications   Medication Sig Start Date End Date Taking? Authorizing Provider  alendronate (FOSAMAX) 70 MG tablet Take 1 tablet (70 mg total) by mouth every 7 (seven) days. Take with a full glass of water on an empty stomach. 06/16/12  Yes Ethelda Chick, MD  Ascorbic Acid (VITAMIN C) 1000 MG tablet Take 1,000 mg by mouth daily.   Yes Historical Provider, MD  aspirin 81 MG tablet Take 81 mg by mouth daily.   Yes Historical Provider, MD  cholecalciferol (VITAMIN D) 1000 UNITS  tablet Take 1,000 Units by mouth daily.   Yes Historical Provider, MD  levothyroxine (SYNTHROID, LEVOTHROID) 50 MCG tablet Take 1 tablet (50 mcg total) by mouth daily. 06/16/12  Yes Ethelda Chick, MD  lisinopril-hydrochlorothiazide (PRINZIDE,ZESTORETIC) 20-25 MG per tablet Take 1 tablet by mouth daily. 06/16/12  Yes Ethelda Chick, MD  metFORMIN (GLUCOPHAGE) 500 MG tablet Take 1 tablet (500 mg total) by mouth 3 (three) times daily. 06/16/12  Yes Ethelda Chick, MD  metoprolol  succinate (TOPROL-XL) 50 MG 24 hr tablet 1.5 tablets daily 06/16/12  Yes Ethelda Chick, MD  Multiple Vitamin (MULTIVITAMINS PO) Take by mouth.   Yes Historical Provider, MD  omeprazole (PRILOSEC) 20 MG capsule Take 1 capsule (20 mg total) by mouth daily. 11/18/11  Yes Ethelda Chick, MD  PRESCRIPTION MEDICATION T/S Bayer Contour Test Strips 100   Yes Historical Provider, MD  simvastatin (ZOCOR) 40 MG tablet Take 1 tablet (40 mg total) by mouth every evening. 06/16/12  Yes Ethelda Chick, MD  solifenacin (VESICARE) 5 MG tablet Take 1 tablet (5 mg total) by mouth daily. 06/16/12  Yes Ethelda Chick, MD  meloxicam (MOBIC) 15 MG tablet Take 1 tablet (15 mg total) by mouth daily. 06/16/12   Ethelda Chick, MD    No Known Allergies  History   Social History  . Marital Status: Married    Spouse Name: N/A    Number of Children: 3  . Years of Education: 12   Occupational History  . retired     08/05/06   Social History Main Topics  . Smoking status: Never Smoker   . Smokeless tobacco: Not on file  . Alcohol Use: No  . Drug Use: No  . Sexually Active: Not on file   Other Topics Concern  . Not on file   Social History Narrative   Consumes a minimal amount of caffeine.Smoke alarms and carbon monoxide detector in the home. Always uses seat belts.Organ donor;No. Guns in the home: No unsecured guns in the home.       Children:  3 children (53,51,41) 5 grandchildren, 5 gg.       Marital status:  Married since 27,second marriage, first husband died 08/05/02 (60 years of marriage). Happily married;no abuse.       Exercise: Walking 45-60 minutes. 3 x week; exercise program with church.       Living Will:  Patient DOES have Living Will; FULL CODE but no prolonged measures.       Lives with spouse. Independent with all ADL's; drives.    Family History  Problem Relation Age of Onset  . Cancer Father   . Seizures Sister     Epilepsy  . Cancer Sister     breast  . Cirrhosis Brother     deceased  .  Osteoporosis Mother   . Arthritis Mother   . Diabetes Sister     insulin dependent       Objective:   Physical Exam  Nursing note and vitals reviewed. Constitutional: She is oriented to person, place, and time. She appears well-developed and well-nourished. No distress.  HENT:  Head: Normocephalic and atraumatic.  Mouth/Throat: Oropharynx is clear and moist.  Eyes: Conjunctivae and EOM are normal. Pupils are equal, round, and reactive to light.  Neck: Normal range of motion. Neck supple. No JVD present. No thyromegaly present.  Cardiovascular: Normal rate, regular rhythm, normal heart sounds and intact distal pulses.  Exam reveals no gallop and no  friction rub.   No murmur heard. Pulmonary/Chest: Effort normal and breath sounds normal. She has no wheezes. She has no rales.  Abdominal: Soft. Bowel sounds are normal. There is no tenderness. There is no rebound and no guarding.  Musculoskeletal:       Right knee: She exhibits normal range of motion, no swelling, normal meniscus and no MCL laxity. Tenderness found. Medial joint line tenderness noted. No lateral joint line, no MCL, no LCL and no patellar tendon tenderness noted.       Left knee: She exhibits normal range of motion, no swelling, no bony tenderness, normal meniscus and no MCL laxity. Tenderness found. Medial joint line and lateral joint line tenderness noted. No MCL, no LCL and no patellar tendon tenderness noted.  Lymphadenopathy:    She has no cervical adenopathy.  Neurological: She is alert and oriented to person, place, and time. No cranial nerve deficit. She exhibits normal muscle tone. Coordination normal.  Skin: Skin is warm and dry. No rash noted. She is not diaphoretic. No erythema.  Psychiatric: She has a normal mood and affect. Her behavior is normal.    [facility] reading (PRIMARY) by  Dr. Katrinka Blazing.  R KNEE: +arthritic changes.        Assessment & Plan:  Essential hypertension, benign - Plan: CBC, CK, Comprehensive  metabolic panel, metoprolol succinate (TOPROL-XL) 50 MG 24 hr tablet  Pure hypercholesterolemia - Plan: CBC, CK, Lipid panel  Type II or unspecified type diabetes mellitus with neurological manifestations, not stated as uncontrolled(250.60) - Plan: CBC, CK, Comprehensive metabolic panel, Hemoglobin A1c  Type II or unspecified type diabetes mellitus without mention of complication, not stated as uncontrolled  Pain, knee, right - Plan: DG Knee Complete 4 Views Right, meloxicam (MOBIC) 15 MG tablet   1. HTN: uncontrolled; increase Metoprolol ER 50mg  to 1.5 tablets daily.  Obtain labs. 2.  DMII: controlled; obtain labs; refills provided; no change in management. 3. Hyperlipidemia: controlled; obtain labs; continue current dose of medication; refills provided. 4.  Urinary incontinence: improved with Vesicare 60%; continue current dose of medication. 5.  R knee pain: New.  rx for Meloxicam provided to use daily; recommend rest, elevate, ice daily for 15-20 minutes. If no improvement in one month, to call for ortho referral.  Meds ordered this encounter  Medications  . PRESCRIPTION MEDICATION    Sig: T/S Bayer Contour Test Strips 100  . solifenacin (VESICARE) 5 MG tablet    Sig: Take 1 tablet (5 mg total) by mouth daily.    Dispense:  90 tablet    Refill:  3  . levothyroxine (SYNTHROID, LEVOTHROID) 50 MCG tablet    Sig: Take 1 tablet (50 mcg total) by mouth daily.    Dispense:  90 tablet    Refill:  3  . metoprolol succinate (TOPROL-XL) 50 MG 24 hr tablet    Sig: 1.5 tablets daily    Dispense:  135 tablet    Refill:  3  . alendronate (FOSAMAX) 70 MG tablet    Sig: Take 1 tablet (70 mg total) by mouth every 7 (seven) days. Take with a full glass of water on an empty stomach.    Dispense:  12 tablet    Refill:  3  . lisinopril-hydrochlorothiazide (PRINZIDE,ZESTORETIC) 20-25 MG per tablet    Sig: Take 1 tablet by mouth daily.    Dispense:  90 tablet    Refill:  3  . simvastatin (ZOCOR)  40 MG tablet    Sig: Take 1  tablet (40 mg total) by mouth every evening.    Dispense:  90 tablet    Refill:  3  . metFORMIN (GLUCOPHAGE) 500 MG tablet    Sig: Take 1 tablet (500 mg total) by mouth 3 (three) times daily.    Dispense:  270 tablet    Refill:  3  . meloxicam (MOBIC) 15 MG tablet    Sig: Take 1 tablet (15 mg total) by mouth daily.    Dispense:  30 tablet    Refill:  0

## 2012-06-18 ENCOUNTER — Encounter: Payer: Self-pay | Admitting: Radiology

## 2012-09-22 ENCOUNTER — Encounter: Payer: Self-pay | Admitting: Family Medicine

## 2012-09-22 ENCOUNTER — Ambulatory Visit (INDEPENDENT_AMBULATORY_CARE_PROVIDER_SITE_OTHER): Payer: Medicare Other | Admitting: Family Medicine

## 2012-09-22 VITALS — BP 194/80 | HR 57 | Temp 98.0°F | Resp 16 | Ht 63.25 in | Wt 202.0 lb

## 2012-09-22 DIAGNOSIS — I10 Essential (primary) hypertension: Secondary | ICD-10-CM

## 2012-09-22 DIAGNOSIS — E119 Type 2 diabetes mellitus without complications: Secondary | ICD-10-CM

## 2012-09-22 DIAGNOSIS — E7800 Pure hypercholesterolemia, unspecified: Secondary | ICD-10-CM

## 2012-09-22 DIAGNOSIS — N3941 Urge incontinence: Secondary | ICD-10-CM

## 2012-09-22 LAB — CBC
Platelets: 267 10*3/uL (ref 150–400)
RBC: 4.91 MIL/uL (ref 3.87–5.11)
RDW: 13.8 % (ref 11.5–15.5)
WBC: 7.4 10*3/uL (ref 4.0–10.5)

## 2012-09-22 LAB — LIPID PANEL
LDL Cholesterol: 57 mg/dL (ref 0–99)
Triglycerides: 178 mg/dL — ABNORMAL HIGH (ref <150)
VLDL: 36 mg/dL (ref 0–40)

## 2012-09-22 LAB — COMPREHENSIVE METABOLIC PANEL
ALT: 23 U/L (ref 0–35)
AST: 17 U/L (ref 0–37)
Albumin: 4.3 g/dL (ref 3.5–5.2)
Alkaline Phosphatase: 43 U/L (ref 39–117)
BUN: 14 mg/dL (ref 6–23)
Potassium: 4.1 mEq/L (ref 3.5–5.3)
Sodium: 131 mEq/L — ABNORMAL LOW (ref 135–145)

## 2012-09-22 LAB — CK: Total CK: 64 U/L (ref 7–177)

## 2012-09-22 MED ORDER — AMLODIPINE BESYLATE 5 MG PO TABS: 5.0000 mg | ORAL_TABLET | Freq: Every day | ORAL | Status: DC

## 2012-09-22 MED ORDER — LISINOPRIL-HYDROCHLOROTHIAZIDE 20-25 MG PO TABS: 1.0000 | ORAL_TABLET | Freq: Every day | ORAL | Status: DC

## 2012-09-22 MED ORDER — METOPROLOL SUCCINATE ER 50 MG PO TB24: ORAL_TABLET | ORAL | Status: DC

## 2012-09-22 MED ORDER — LEVOTHYROXINE SODIUM 50 MCG PO TABS: 50.0000 ug | ORAL_TABLET | Freq: Every day | ORAL | Status: DC

## 2012-09-22 MED ORDER — SIMVASTATIN 40 MG PO TABS: 40.0000 mg | ORAL_TABLET | Freq: Every evening | ORAL | Status: DC

## 2012-09-22 MED ORDER — SOLIFENACIN SUCCINATE 5 MG PO TABS: 5.0000 mg | ORAL_TABLET | Freq: Every day | ORAL | Status: DC

## 2012-09-22 MED ORDER — METFORMIN HCL 500 MG PO TABS: 500.0000 mg | ORAL_TABLET | Freq: Three times a day (TID) | ORAL | Status: DC

## 2012-09-22 MED ORDER — ALENDRONATE SODIUM 70 MG PO TABS: 70.0000 mg | ORAL_TABLET | ORAL | Status: DC

## 2012-09-22 MED ORDER — GLUCOSE BLOOD VI STRP: ORAL_STRIP | Status: DC

## 2012-09-22 NOTE — Progress Notes (Signed)
[address]   [place], [place]  [address]   [phone]  Subjective:    Patient ID: Kara Rogers, female    DOB: 02/15/1932, 79 y.o.   MRN: [id number]  HPI This 79 y.o. female presents for three month follow-up:  1.  HTN: three month follow-up; management made at last visit included increasing Toprol to 1.5 tablets daily.  Home readings remaining in 150-180s systolic.  Reports good compliance with medication, good tolerance to medication; good symptom control.  2.  DMII: three month follow-up; no changes to management made at last visit; reports good tolerance to medication, good compliance to medication; good symptom control.  Fasting sugars ranging 100-130s.    3. Hyperlipidemia: three month follow-up; no changes to management made at last visit; reports good tolerance to medication, good compliance to medication; good symptom control.    4. Knee pain: improved within two weeks after last visit with Meloxicam and home exercise program; no further pain.  5. Overactive bladder/urge incontinence: really well controlled on Vesicare; happy with results; not interested in increasing dose.  Review of Systems  Constitutional: Negative for fever, chills, diaphoresis, activity change, appetite change and fatigue.  Eyes: Negative for photophobia and visual disturbance.  Respiratory: Negative for shortness of breath, wheezing and stridor.   Cardiovascular: Positive for leg swelling. Negative for chest pain and palpitations.  Endocrine: Negative for cold intolerance, heat intolerance, polydipsia, polyphagia and polyuria.  Musculoskeletal: Negative for joint swelling and arthralgias.  Skin: Negative for color change, pallor, rash and wound.  Neurological: Negative for dizziness, tremors, seizures, syncope, facial asymmetry, speech difficulty, weakness, light-headedness, numbness and headaches.   Past Medical History  Diagnosis Date  . Essential hypertension, benign   . Pure  hypercholesterolemia   . Type II or unspecified type diabetes mellitus without mention of complication, not stated as uncontrolled   . Osteoporosis   . Leg cramps   . Unspecified disorder of skin and subcutaneous tissue   . Diarrhea   . Osteopenia   . Unspecified hypothyroidism   . Unspecified vitamin D deficiency   . Personal history of colonic polyps   . Hypercalcemia   . Chicken pox   . Measles   . Mumps    No Known Allergies Current Outpatient Prescriptions on File Prior to Visit  Medication Sig Dispense Refill  . Ascorbic Acid (VITAMIN C) 1000 MG tablet Take 1,000 mg by mouth daily.      Marland Kitchen aspirin 81 MG tablet Take 81 mg by mouth daily.      . cholecalciferol (VITAMIN D) 1000 UNITS tablet Take 1,000 Units by mouth daily.      . meloxicam (MOBIC) 15 MG tablet Take 1 tablet (15 mg total) by mouth daily.  30 tablet  0  . Multiple Vitamin (MULTIVITAMINS PO) Take by mouth.      Marland Kitchen PRESCRIPTION MEDICATION T/S Bayer Contour Test Strips 100      . omeprazole (PRILOSEC) 20 MG capsule Take 1 capsule (20 mg total) by mouth daily.  30 capsule  3   No current facility-administered medications on file prior to visit.       Objective:   Physical Exam  Nursing note and vitals reviewed. Constitutional: She appears well-developed and well-nourished. No distress.  HENT:  Head: Normocephalic and atraumatic.  Mouth/Throat: Oropharynx is clear and moist.  Eyes: Conjunctivae and EOM are normal. Pupils are equal, round, and reactive to light.  Neck: Normal range of motion. Neck supple. No JVD present. No thyromegaly  present.  Cardiovascular: Normal rate, regular rhythm, normal heart sounds and intact distal pulses.  Exam reveals no gallop and no friction rub.   No murmur heard. Pulmonary/Chest: Effort normal and breath sounds normal. She has no wheezes. She has no rales.  Abdominal: Soft. Bowel sounds are normal. She exhibits no distension. There is no tenderness. There is no rebound and no  guarding.  Lymphadenopathy:    She has no cervical adenopathy.  Skin: Skin is warm and dry. She is not diaphoretic.  Psychiatric: She has a normal mood and affect. Her behavior is normal.       Assessment & Plan:  Diabetes - Plan: POCT glycosylated hemoglobin (Hb A1C)  Pure hypercholesterolemia - Plan: Lipid panel  Essential hypertension, benign - Plan: CBC, Comprehensive metabolic panel, CK, amLODipine (NORVASC) 5 MG tablet, metoprolol succinate (TOPROL-XL) 50 MG 24 hr tablet  Urinary incontinence, urge   1. DMII: controlled; obtain labs; continue current medication. 2.  Hypercholesterolemia: controlled; obtain labs; continue current medication. 3.  HTN: uncontrolled; add Amlodipine 5mg  q d; continue all other medications at current doses. 4. Urge Incontinence: controlled with Vesicare 5mg  daily.  No change in medication.  Meds ordered this encounter  Medications  . amLODipine (NORVASC) 5 MG tablet    Sig: Take 1 tablet (5 mg total) by mouth daily.    Dispense:  30 tablet    Refill:  5  . solifenacin (VESICARE) 5 MG tablet    Sig: Take 1 tablet (5 mg total) by mouth daily.    Dispense:  90 tablet    Refill:  3  . simvastatin (ZOCOR) 40 MG tablet    Sig: Take 1 tablet (40 mg total) by mouth every evening.    Dispense:  90 tablet    Refill:  3  . metoprolol succinate (TOPROL-XL) 50 MG 24 hr tablet    Sig: 1.5 tablets daily    Dispense:  135 tablet    Refill:  3  . metFORMIN (GLUCOPHAGE) 500 MG tablet    Sig: Take 1 tablet (500 mg total) by mouth 3 (three) times daily.    Dispense:  270 tablet    Refill:  3  . lisinopril-hydrochlorothiazide (PRINZIDE,ZESTORETIC) 20-25 MG per tablet    Sig: Take 1 tablet by mouth daily.    Dispense:  90 tablet    Refill:  3  . levothyroxine (SYNTHROID, LEVOTHROID) 50 MCG tablet    Sig: Take 1 tablet (50 mcg total) by mouth daily.    Dispense:  90 tablet    Refill:  3  . alendronate (FOSAMAX) 70 MG tablet    Sig: Take 1 tablet (70 mg  total) by mouth every 7 (seven) days. Take with a full glass of water on an empty stomach.    Dispense:  12 tablet    Refill:  3  . glucose blood test strip    Sig: BAYER CONTOUR TEST STRIPS CHECK SUGAR ONCE DAILY    Dispense:  100 each    Refill:  3

## 2012-10-27 ENCOUNTER — Other Ambulatory Visit: Payer: Self-pay

## 2012-10-27 DIAGNOSIS — I10 Essential (primary) hypertension: Secondary | ICD-10-CM

## 2012-10-27 MED ORDER — GLUCOSE BLOOD VI STRP: ORAL_STRIP | Status: DC

## 2012-10-27 MED ORDER — ACCU-CHEK MULTICLIX LANCETS MISC: Status: DC

## 2012-10-27 MED ORDER — ACCU-CHEK AVIVA PLUS W/DEVICE KIT: 1.0000 | PACK | Freq: Once | Status: DC

## 2012-10-27 MED ORDER — ACCU-CHEK SPIRIT BATTERY KIT MISC: Status: DC

## 2012-10-27 MED ORDER — ACCU-CHEK AVIVA VI SOLN: Status: DC

## 2012-10-27 MED ORDER — AMLODIPINE BESYLATE 5 MG PO TABS: 5.0000 mg | ORAL_TABLET | Freq: Every day | ORAL | Status: DC

## 2012-11-06 ENCOUNTER — Telehealth: Payer: Self-pay | Admitting: Radiology

## 2012-11-06 NOTE — Telephone Encounter (Signed)
Pharmacy is concerned about Amlodipine and Simvastatin together. Call back [phone] ref # [id number]

## 2012-11-06 NOTE — Telephone Encounter (Signed)
Spoke with pharmacist --- new guidelines, that Simvastatin 40mg  exceeds 20mg  dose while taking Amlodipine.  Agreeable to decreasing dose of Simvastatin to 20mg  daily.

## 2012-11-06 NOTE — Telephone Encounter (Signed)
Patient advised by myself; to start Simvastatin 40mg  1/2 daily.

## 2012-12-29 ENCOUNTER — Ambulatory Visit: Payer: Medicare Other | Admitting: Family Medicine

## 2013-01-01 ENCOUNTER — Encounter: Payer: Self-pay | Admitting: Family Medicine

## 2013-01-01 ENCOUNTER — Ambulatory Visit (INDEPENDENT_AMBULATORY_CARE_PROVIDER_SITE_OTHER): Payer: Medicare Other | Admitting: Family Medicine

## 2013-01-01 VITALS — BP 171/79 | HR 68 | Temp 97.8°F | Resp 16 | Ht 63.5 in | Wt 204.0 lb

## 2013-01-01 DIAGNOSIS — Z23 Encounter for immunization: Secondary | ICD-10-CM

## 2013-01-01 DIAGNOSIS — R0989 Other specified symptoms and signs involving the circulatory and respiratory systems: Secondary | ICD-10-CM

## 2013-01-01 DIAGNOSIS — E119 Type 2 diabetes mellitus without complications: Secondary | ICD-10-CM

## 2013-01-01 DIAGNOSIS — R6889 Other general symptoms and signs: Secondary | ICD-10-CM

## 2013-01-01 DIAGNOSIS — E7800 Pure hypercholesterolemia, unspecified: Secondary | ICD-10-CM

## 2013-01-01 DIAGNOSIS — I10 Essential (primary) hypertension: Secondary | ICD-10-CM

## 2013-01-01 LAB — POCT URINALYSIS DIPSTICK
Bilirubin, UA: NEGATIVE
Blood, UA: NEGATIVE
Glucose, UA: NEGATIVE
Ketones, UA: NEGATIVE
Nitrite, UA: POSITIVE
Protein, UA: NEGATIVE
Spec Grav, UA: 1.015
pH, UA: 6

## 2013-01-01 LAB — COMPREHENSIVE METABOLIC PANEL
ALT: 27 U/L (ref 0–35)
AST: 17 U/L (ref 0–37)
Albumin: 4.1 g/dL (ref 3.5–5.2)
Calcium: 9.8 mg/dL (ref 8.4–10.5)
Chloride: 100 mEq/L (ref 96–112)
Potassium: 4.1 mEq/L (ref 3.5–5.3)
Sodium: 138 mEq/L (ref 135–145)

## 2013-01-01 LAB — LIPID PANEL
LDL Cholesterol: 57 mg/dL (ref 0–99)
Total CHOL/HDL Ratio: 2.5 Ratio
VLDL: 21 mg/dL (ref 0–40)

## 2013-01-01 LAB — CBC WITH DIFFERENTIAL/PLATELET
Eosinophils Relative: 1 % (ref 0–5)
Lymphocytes Relative: 29 % (ref 12–46)
Lymphs Abs: 2.4 10*3/uL (ref 0.7–4.0)
MCV: 84.8 fL (ref 78.0–100.0)
Neutro Abs: 5.2 10*3/uL (ref 1.7–7.7)
Neutrophils Relative %: 63 % (ref 43–77)
Platelets: 319 10*3/uL (ref 150–400)
RBC: 4.68 MIL/uL (ref 3.87–5.11)
WBC: 8.2 10*3/uL (ref 4.0–10.5)

## 2013-01-01 LAB — CK: Total CK: 69 U/L (ref 7–177)

## 2013-01-01 LAB — HEMOGLOBIN A1C
Hgb A1c MFr Bld: 6.3 % — ABNORMAL HIGH (ref <5.7)
Mean Plasma Glucose: 134 mg/dL — ABNORMAL HIGH (ref <117)

## 2013-01-01 MED ORDER — LOSARTAN POTASSIUM-HCTZ 100-25 MG PO TABS: 1.0000 | ORAL_TABLET | Freq: Every day | ORAL | Status: DC

## 2013-01-01 MED ORDER — SIMVASTATIN 20 MG PO TABS: 20.0000 mg | ORAL_TABLET | Freq: Every evening | ORAL | Status: DC

## 2013-01-01 MED ORDER — AMLODIPINE BESYLATE 10 MG PO TABS: 10.0000 mg | ORAL_TABLET | Freq: Every day | ORAL | Status: DC

## 2013-01-01 MED ORDER — LEVOTHYROXINE SODIUM 50 MCG PO TABS: 50.0000 ug | ORAL_TABLET | Freq: Every day | ORAL | Status: DC

## 2013-01-01 MED ORDER — METOPROLOL SUCCINATE ER 50 MG PO TB24: ORAL_TABLET | ORAL | Status: DC

## 2013-01-01 MED ORDER — METFORMIN HCL 500 MG PO TABS: 500.0000 mg | ORAL_TABLET | Freq: Three times a day (TID) | ORAL | Status: DC

## 2013-01-01 MED ORDER — SOLIFENACIN SUCCINATE 5 MG PO TABS: 5.0000 mg | ORAL_TABLET | Freq: Every day | ORAL | Status: DC

## 2013-01-01 MED ORDER — ALENDRONATE SODIUM 70 MG PO TABS: 70.0000 mg | ORAL_TABLET | ORAL | Status: DC

## 2013-01-01 NOTE — Progress Notes (Signed)
Subjective:    Patient ID: Kara Rogers, female    DOB: [birth year], 79 y.o.   MRN: [id number]  HPI Patient presents today for three month follow up of BP. Started amlodpine 5 mg last visit. Checking blood pressure at home 140-170/70. Has had some leg swelling with a recent trip, two days ago. Has predictable swelling with travel. Prior to travel, no leg swelling.  Blood sugars 140-150. Always higher in the morning.  Has had tickle in throat for 6 months. Has been causing a cough. Cough became deeper, was seen in walk in and given cough medicine and antibiotic. No sneezing, no PND, no stuffy nose. Rare indigestion.   Knee pain resolved.  Last CPE 02/20/2012 Flu- today   Review of Systems  Constitutional: Negative for fever, chills, diaphoresis and fatigue.  HENT: Negative for postnasal drip, rhinorrhea, sinus pressure, sneezing, sore throat and voice change.   Respiratory: Positive for cough. Negative for shortness of breath, wheezing and stridor.   Cardiovascular: Positive for leg swelling. Negative for chest pain and palpitations.  Gastrointestinal: Negative for nausea, vomiting, abdominal pain and diarrhea.  Endocrine: Negative for cold intolerance, heat intolerance, polydipsia, polyphagia and polyuria.  Musculoskeletal: Negative for arthralgias.  Skin: Negative for color change, pallor, rash and wound.  Neurological: Negative for dizziness, tremors, seizures, syncope, facial asymmetry, speech difficulty, weakness, light-headedness, numbness and headaches.   Past Medical History  Diagnosis Date  . Essential hypertension, benign   . Pure hypercholesterolemia   . Type II or unspecified type diabetes mellitus without mention of complication, not stated as uncontrolled   . Osteoporosis   . Leg cramps   . Unspecified disorder of skin and subcutaneous tissue   . Unspecified hypothyroidism   . Unspecified vitamin D deficiency   . Personal history of colonic polyps   . Hypercalcemia    . Chicken pox   . Measles   . Mumps    Past Surgical History  Procedure Laterality Date  . Neck surgery      L neck cyst resection  . Thyroid cyst excision    . Hernia repair  1981  . Cataract surgery  02/2011    Left; Right 2009  . Breast ductal resection  02/2010    Left-[place]. Resection of thickened breast duct. Symptoms: L breast drainage hemocult +  . Abdominal hysterectomy  1980    R Ovary intact.  DUB.  Marland Kitchen Cholecystectomy     No Known Allergies Current Outpatient Prescriptions on File Prior to Visit  Medication Sig Dispense Refill  . Ascorbic Acid (VITAMIN C) 1000 MG tablet Take 1,000 mg by mouth daily.      Marland Kitchen aspirin 81 MG tablet Take 81 mg by mouth daily.      . Blood Glucose Calibration (ACCU-CHEK AVIVA) SOLN Use to test blood sugar once daily. Dx code 250.00.  1 each  0  . Blood Glucose Monitoring Suppl (ACCU-CHEK AVIVA PLUS) W/DEVICE KIT 1 Device by Does not apply route once. Use to test blood sugar once daily. Dx code 250.00  1 kit  0  . cholecalciferol (VITAMIN D) 1000 UNITS tablet Take 1,000 Units by mouth daily.      Marland Kitchen glucose blood (ACCU-CHEK AVIVA PLUS) test strip Use to check blood sugars once daily. Dx code 250.00.  100 each  3  . glucose blood test strip BAYER CONTOUR TEST STRIPS CHECK SUGAR ONCE DAILY  100 each  3  . Lancets (ACCU-CHEK MULTICLIX) lancets Use to test blood sugar once daily.  Dx code 250.00.  100 each  3  . Multiple Vitamin (MULTIVITAMINS PO) Take by mouth.      . Insulin Pump Accessories (ACCU-CHEK SPIRIT BATTERY KIT) MISC Use to test blood sugar once daily. Dx code 250.00  1 each  0  . meloxicam (MOBIC) 15 MG tablet Take 1 tablet (15 mg total) by mouth daily.  30 tablet  0  . omeprazole (PRILOSEC) 20 MG capsule Take 1 capsule (20 mg total) by mouth daily.  30 capsule  3  . PRESCRIPTION MEDICATION T/S Bayer Contour Test Strips 100       No current facility-administered medications on file prior to visit.   History   Social History  .  Marital Status: Married    Spouse Name: Pamelia Hoit    Number of Children: 3  . Years of Education: 12   Occupational History  . retired     07/19/2006   Social History Main Topics  . Smoking status: Never Smoker   . Smokeless tobacco: Not on file  . Alcohol Use: No  . Drug Use: No  . Sexual Activity: Not on file   Other Topics Concern  . Not on file   Social History Narrative   Marital status:  Married since 79,second marriage, first husband died 07-19-02 (59 years of marriage). Happily married;no abuse.      Lives: with husband.      Children:  3 children (47,51,41) son in [place], 2 daughters in [place];  5 grandchildren, 5 gg.        Tobacco: none       Alcohol: none        Exercise: Walking 45-60 minutes. 3 x week; exercise program with church.       Living Will:  Patient DOES have Living Will; +CPR but DNI.      ADLs:   Independent with all ADL's; drives.  No assistant devices.  No falls in 2012/07/18.        Caffeine:  Consumes a minimal amount of caffeine.      Home safety:  Smoke alarms and carbon monoxide detector in the home.       Seatbelts:  Always uses seat belts.      Organ donor:  No.       Guns:  Guns in the home: No unsecured guns in the home.        Objective:   Physical Exam  Constitutional: She is oriented to person, place, and time. She appears well-developed and well-nourished. No distress.  HENT:  Head: Normocephalic and atraumatic.  Right Ear: External ear normal.  Left Ear: External ear normal.  Nose: Nose normal.  Mouth/Throat: Oropharynx is clear and moist.  Eyes: Conjunctivae and EOM are normal. Pupils are equal, round, and reactive to light.  Neck: Normal range of motion. Neck supple. Carotid bruit is not present. No thyromegaly present.  Cardiovascular: Normal rate, regular rhythm, normal heart sounds and intact distal pulses.  Exam reveals no gallop and no friction rub.   No murmur heard. Pulmonary/Chest: Effort normal and breath sounds normal. She has no  wheezes. She has no rales.  Abdominal: Soft. Bowel sounds are normal. She exhibits no distension and no mass. There is no tenderness. There is no rebound and no guarding.  Lymphadenopathy:    She has no cervical adenopathy.  Neurological: She is alert and oriented to person, place, and time. No cranial nerve deficit.  Skin: Skin is warm and dry. No rash noted. She is  not diaphoretic. No erythema. No pallor.  Psychiatric: She has a normal mood and affect. Her behavior is normal.   INFLUENZA VACCINE ADMINISTERED.    Assessment & Plan:  Type II or unspecified type diabetes mellitus without mention of complication, not stated as uncontrolled - Plan: Comprehensive metabolic panel, Hemoglobin A1c  Pure hypercholesterolemia - Plan: Lipid panel  Essential hypertension, benign - Plan: CBC with Differential, CK, Comprehensive metabolic panel, POCT urinalysis dipstick, DISCONTINUED: amLODipine (NORVASC) 10 MG tablet, DISCONTINUED: metoprolol succinate (TOPROL-XL) 50 MG 24 hr tablet  Need for prophylactic vaccination and inoculation against influenza - Plan: Flu Vaccine QUAD 36+ mos IM  Tickle in throat  1. DMII: Controlled; obtain labs; continue current medications. 2.  HTN: uncontrolled; increase Amlodipine to 10mg  daily.  Obtain labs. 3.  Hyperlipidemia:  Controlled; obtain labs; rx for Simvastatin 20mg  daily provided. 4.  Tickle in throat: persistent.  Switch Lisinopril/HCTZ to Losartan/HCTZ. 5.  S/p flu vaccine.  Meds ordered this encounter  Medications  . DISCONTD: triamterene-hydrochlorothiazide (MAXZIDE-25) 37.5-25 MG per tablet    Sig: Take 1 tablet by mouth daily.  Marland Kitchen DISCONTD: losartan-hydrochlorothiazide (HYZAAR) 100-25 MG per tablet    Sig: Take 1 tablet by mouth daily.    Dispense:  90 tablet    Refill:  3    To replace Lisinopril/HCTZ; please delete further refills on Lisinopril.  Marland Kitchen DISCONTD: simvastatin (ZOCOR) 20 MG tablet    Sig: Take 1 tablet (20 mg total) by mouth every  evening.    Dispense:  90 tablet    Refill:  3  . DISCONTD: amLODipine (NORVASC) 10 MG tablet    Sig: Take 1 tablet (10 mg total) by mouth daily.    Dispense:  90 tablet    Refill:  3    TO REPLACE AMLODIPINE 5MG  DAILY.  Marland Kitchen DISCONTD: metFORMIN (GLUCOPHAGE) 500 MG tablet    Sig: Take 1 tablet (500 mg total) by mouth 3 (three) times daily.    Dispense:  270 tablet    Refill:  3  . DISCONTD: levothyroxine (SYNTHROID, LEVOTHROID) 50 MCG tablet    Sig: Take 1 tablet (50 mcg total) by mouth daily.    Dispense:  90 tablet    Refill:  3  . DISCONTD: metoprolol succinate (TOPROL-XL) 50 MG 24 hr tablet    Sig: 1.5 tablets daily    Dispense:  135 tablet    Refill:  3  . DISCONTD: solifenacin (VESICARE) 5 MG tablet    Sig: Take 1 tablet (5 mg total) by mouth daily.    Dispense:  90 tablet    Refill:  3  . DISCONTD: alendronate (FOSAMAX) 70 MG tablet    Sig: Take 1 tablet (70 mg total) by mouth every 7 (seven) days. Take with a full glass of water on an empty stomach.    Dispense:  12 tablet    Refill:  3    DM- Showed patient how to use her new glucometer, including control testing.  Nilda Simmer, M.D.  [facility] [address] [place], [place]  [address] [phone] phone [phone] fax

## 2013-01-01 NOTE — Patient Instructions (Signed)
1. Changes made at visit:  A.  Increase Amlodipine to 10mg  one tablet daily for blood pressure. B. Switch Lisinopril/HCTZ to Losartan/HCTZ (to see if tickle in throat is side effect of Lisinopril). C.  Decreased Simvastatin from 40mg  to 20mg  tablets.

## 2013-01-24 ENCOUNTER — Telehealth: Payer: Self-pay | Admitting: *Deleted

## 2013-01-24 NOTE — Telephone Encounter (Signed)
Called and spoke with patient and she stated that labs were performed on 01/01/2013 for kidney function.

## 2013-02-23 ENCOUNTER — Telehealth: Payer: Self-pay

## 2013-02-23 NOTE — Telephone Encounter (Signed)
PT STATES SHE WAS GIVEN HER MEDS BUT THE BP MEDICINE ISN'T WANT SHE USUALLY TAKE AND WANTED TO KNOW IF THE DR CHANGED IT. PLEASE CALL [phone] SHE NEED TO TAKE IT ASAP

## 2013-02-23 NOTE — Telephone Encounter (Signed)
A. Increase Amlodipine to 10mg  one tablet daily for blood pressure.  B. Switch Lisinopril/HCTZ to Losartan/HCTZ (to see if tickle in throat is side effect of Lisinopril).  C. Decreased Simvastatin from 40mg  to 20mg  tablets.   Called her Dr Katrinka Blazing changed Lisinopril to Losartan due to tickle in her throat, patient states she does recall this, and will take new med.

## 2013-04-06 ENCOUNTER — Ambulatory Visit: Payer: Medicare Other

## 2013-04-06 ENCOUNTER — Encounter: Payer: Self-pay | Admitting: Family Medicine

## 2013-04-06 ENCOUNTER — Ambulatory Visit (INDEPENDENT_AMBULATORY_CARE_PROVIDER_SITE_OTHER): Payer: Medicare Other | Admitting: Family Medicine

## 2013-04-06 VITALS — BP 138/78 | HR 60 | Temp 98.2°F | Resp 16 | Ht 62.25 in | Wt 192.8 lb

## 2013-04-06 DIAGNOSIS — E119 Type 2 diabetes mellitus without complications: Secondary | ICD-10-CM

## 2013-04-06 DIAGNOSIS — I10 Essential (primary) hypertension: Secondary | ICD-10-CM

## 2013-04-06 DIAGNOSIS — E7800 Pure hypercholesterolemia, unspecified: Secondary | ICD-10-CM

## 2013-04-06 DIAGNOSIS — M545 Low back pain, unspecified: Secondary | ICD-10-CM

## 2013-04-06 DIAGNOSIS — E039 Hypothyroidism, unspecified: Secondary | ICD-10-CM

## 2013-04-06 DIAGNOSIS — Z1239 Encounter for other screening for malignant neoplasm of breast: Secondary | ICD-10-CM

## 2013-04-06 DIAGNOSIS — Z0000 Encounter for general adult medical examination without abnormal findings: Secondary | ICD-10-CM

## 2013-04-06 DIAGNOSIS — Z139 Encounter for screening, unspecified: Secondary | ICD-10-CM

## 2013-04-06 DIAGNOSIS — M810 Age-related osteoporosis without current pathological fracture: Secondary | ICD-10-CM

## 2013-04-06 LAB — POCT UA - MICROSCOPIC ONLY
Casts, Ur, LPF, POC: NEGATIVE
Crystals, Ur, HPF, POC: NEGATIVE
Mucus, UA: POSITIVE
RBC, urine, microscopic: NEGATIVE
Yeast, UA: NEGATIVE

## 2013-04-06 LAB — COMPLETE METABOLIC PANEL WITH GFR
ALT: 22 U/L (ref 0–35)
AST: 15 U/L (ref 0–37)
Albumin: 4.8 g/dL (ref 3.5–5.2)
Alkaline Phosphatase: 47 U/L (ref 39–117)
BUN: 17 mg/dL (ref 6–23)
CHLORIDE: 99 meq/L (ref 96–112)
CO2: 29 mEq/L (ref 19–32)
Calcium: 10.5 mg/dL (ref 8.4–10.5)
Creat: 0.69 mg/dL (ref 0.50–1.10)
GFR, Est African American: >89 mL/min
GFR, Est Non African American: 83 mL/min
Glucose, Bld: 140 mg/dL — ABNORMAL HIGH (ref 70–99)
Potassium: 4.5 mEq/L (ref 3.5–5.3)
Sodium: 136 mEq/L (ref 135–145)
Total Bilirubin: 0.8 mg/dL (ref 0.2–1.2)
Total Protein: 7.3 g/dL (ref 6.0–8.3)

## 2013-04-06 LAB — CBC WITH DIFFERENTIAL/PLATELET
Basophils Absolute: 0 10*3/uL (ref 0.0–0.1)
Basophils Relative: 0 % (ref 0–1)
Eosinophils Absolute: 0.1 10*3/uL (ref 0.0–0.7)
Eosinophils Relative: 1 % (ref 0–5)
HCT: 43.8 % (ref 36.0–46.0)
Hemoglobin: 15 g/dL (ref 12.0–15.0)
Lymphocytes Relative: 31 % (ref 12–46)
Lymphs Abs: 2.8 10*3/uL (ref 0.7–4.0)
MCH: 29.6 pg (ref 26.0–34.0)
MCHC: 34.2 g/dL (ref 30.0–36.0)
MCV: 86.4 fL (ref 78.0–100.0)
Monocytes Absolute: 0.7 10*3/uL (ref 0.1–1.0)
Monocytes Relative: 8 % (ref 3–12)
Neutro Abs: 5.4 10*3/uL (ref 1.7–7.7)
Neutrophils Relative %: 60 % (ref 43–77)
Platelets: 303 10*3/uL (ref 150–400)
RBC: 5.07 MIL/uL (ref 3.87–5.11)
RDW: 13.2 % (ref 11.5–15.5)
WBC: 9 10*3/uL (ref 4.0–10.5)

## 2013-04-06 LAB — MICROALBUMIN, URINE: Microalb, Ur: 0.5 mg/dL (ref 0.00–1.89)

## 2013-04-06 LAB — LIPID PANEL
CHOL/HDL RATIO: 3.2 ratio
Cholesterol: 157 mg/dL (ref 0–200)
HDL: 49 mg/dL (ref >39)
LDL Cholesterol: 74 mg/dL (ref 0–99)
Triglycerides: 172 mg/dL — ABNORMAL HIGH (ref <150)
VLDL: 34 mg/dL (ref 0–40)

## 2013-04-06 LAB — POCT URINALYSIS DIPSTICK
Bilirubin, UA: NEGATIVE
Glucose, UA: NEGATIVE
Ketones, UA: NEGATIVE
NITRITE UA: POSITIVE
PROTEIN UA: NEGATIVE
RBC UA: NEGATIVE
Spec Grav, UA: 1.01
UROBILINOGEN UA: 0.2
pH, UA: 6

## 2013-04-06 LAB — HEMOGLOBIN A1C
Hgb A1c MFr Bld: 6 % — ABNORMAL HIGH
Mean Plasma Glucose: 126 mg/dL — ABNORMAL HIGH

## 2013-04-06 LAB — IFOBT (OCCULT BLOOD): IMMUNOLOGICAL FECAL OCCULT BLOOD TEST: NEGATIVE

## 2013-04-06 LAB — TSH: TSH: 0.737 u[IU]/mL (ref 0.350–4.500)

## 2013-04-06 MED ORDER — ALENDRONATE SODIUM 70 MG PO TABS
70.0000 mg | ORAL_TABLET | ORAL | Status: DC
Start: 2013-04-06 — End: 2013-07-21

## 2013-04-06 MED ORDER — AMLODIPINE BESYLATE 10 MG PO TABS: 10.0000 mg | ORAL_TABLET | Freq: Every day | ORAL | Status: DC

## 2013-04-06 MED ORDER — SOLIFENACIN SUCCINATE 5 MG PO TABS: 5.0000 mg | ORAL_TABLET | Freq: Every day | ORAL | Status: DC

## 2013-04-06 MED ORDER — METFORMIN HCL 500 MG PO TABS: 500.0000 mg | ORAL_TABLET | Freq: Three times a day (TID) | ORAL | Status: DC

## 2013-04-06 MED ORDER — METOPROLOL SUCCINATE ER 50 MG PO TB24: ORAL_TABLET | ORAL | Status: DC

## 2013-04-06 MED ORDER — SIMVASTATIN 20 MG PO TABS: 20.0000 mg | ORAL_TABLET | Freq: Every evening | ORAL | Status: DC

## 2013-04-06 MED ORDER — LEVOTHYROXINE SODIUM 50 MCG PO TABS: 50.0000 ug | ORAL_TABLET | Freq: Every day | ORAL | Status: DC

## 2013-04-06 MED ORDER — LOSARTAN POTASSIUM-HCTZ 100-25 MG PO TABS: 1.0000 | ORAL_TABLET | Freq: Every day | ORAL | Status: DC

## 2013-04-06 NOTE — Progress Notes (Signed)
Subjective:    Patient ID: Kara Rogers, female    DOB: May 14, 1931, 80 y.o.   MRN: [id number]  HPI This 80 y.o. female presents for Annual Wellness Examination.  Last physical 02/19/12. Pap smear years ago. S/p hysterectomy; one ovary remaining. Mammogram 03/11/2012 [facility]. Colonoscopy 07/27/2007. [place]. Repeat in 5 years.  Two polyps.   Bone density scan 2013. Pneumovax 2008. Zostavax never; +shingles in past. TDAP 02/2011. Influenza vaccine 2014. Eye exam scheduled 04/17/2013.  [place]. Dental exam every six months.  [place].   HTN:  Blood pressure is better; running better at home. Not checking recently due to husband's recent hospitalization.  DMII:  Three month follow-up; no changes to management at last visit; last HgbA1c 6.3.  Not checking sugars well.  Still does not understand new meter.   Compliance with Metformin.  Hyperlipidemia:  Three month follow-up; no changes to management made at last visit; reports good compliance to therapy; good tolerance to therapy; good symptom control.  Hypothyroidism:  One year follow-up; no changes to therapy made at last visit; reports good compliance to therapy; good tolerance to therapy; good symptom control.  +fatigue lately yet stressors due to hospitalization of husband.    Low back pain:  Onset five years ago after fall out of bed; worsening low back pain one year ago.   No radiation into legs; no n/t.  No weakness in legs.  No b/b dysfunction.  No saddle paresthesias. No medications for pain; does have Meloxicam from previous knee pain.  Not interested in PT.  Review of Systems  Constitutional: Positive for fatigue. Negative for fever, chills, diaphoresis, activity change, appetite change and unexpected weight change.  HENT: Negative.   Eyes: Negative.   Respiratory: Negative for apnea, shortness of breath, wheezing and stridor.   Cardiovascular: Positive for leg swelling. Negative for chest pain and palpitations.    Gastrointestinal: Negative for nausea, vomiting, abdominal pain, diarrhea, constipation, blood in stool, abdominal distention, anal bleeding and rectal pain.  Endocrine: Negative for cold intolerance, heat intolerance, polydipsia, polyphagia and polyuria.  Genitourinary: Negative for dysuria, urgency, frequency, hematuria, vaginal bleeding, vaginal discharge, genital sores, vaginal pain and pelvic pain.  Musculoskeletal: Positive for back pain. Negative for arthralgias, gait problem, joint swelling, myalgias, neck pain and neck stiffness.  Skin: Negative for color change, pallor, rash and wound.  Neurological: Negative for dizziness, tremors, seizures, syncope, facial asymmetry, speech difficulty, weakness, light-headedness, numbness and headaches.  Psychiatric/Behavioral: Positive for sleep disturbance and dysphoric mood. Negative for suicidal ideas, self-injury and decreased concentration. The patient is not nervous/anxious.    Past Medical History  Diagnosis Date  . Essential hypertension, benign   . Pure hypercholesterolemia   . Type II or unspecified type diabetes mellitus without mention of complication, not stated as uncontrolled   . Osteoporosis   . Leg cramps   . Unspecified disorder of skin and subcutaneous tissue   . Diarrhea   . Osteopenia   . Unspecified hypothyroidism   . Unspecified vitamin D deficiency   . Personal history of colonic polyps   . Hypercalcemia   . Chicken pox   . Measles   . Mumps    Past Surgical History  Procedure Laterality Date  . Neck surgery      L neck cyst resection  . Thyroid cyst excision    . Hernia repair  1981  . Cataract surgery  02/2011    Left; Right 2009  . Breast ductal resection  02/2010    Left-[place].  Resection of thickened breast duct. Symptoms: L breast drainage hemocult +  . Abdominal hysterectomy  1980    R Ovary intact.  DUB.  Marland Kitchen Cholecystectomy     No Known Allergies Current Outpatient Prescriptions on File Prior  to Visit  Medication Sig Dispense Refill  . alendronate (FOSAMAX) 70 MG tablet Take 1 tablet (70 mg total) by mouth every 7 (seven) days. Take with a full glass of water on an empty stomach.  12 tablet  3  . amLODipine (NORVASC) 10 MG tablet Take 1 tablet (10 mg total) by mouth daily.  90 tablet  3  . Ascorbic Acid (VITAMIN C) 1000 MG tablet Take 1,000 mg by mouth daily.      Marland Kitchen aspirin 81 MG tablet Take 81 mg by mouth daily.      . Blood Glucose Calibration (ACCU-CHEK AVIVA) SOLN Use to test blood sugar once daily. Dx code 250.00.  1 each  0  . Blood Glucose Monitoring Suppl (ACCU-CHEK AVIVA PLUS) W/DEVICE KIT 1 Device by Does not apply route once. Use to test blood sugar once daily. Dx code 250.00  1 kit  0  . cholecalciferol (VITAMIN D) 1000 UNITS tablet Take 1,000 Units by mouth daily.      Marland Kitchen glucose blood (ACCU-CHEK AVIVA PLUS) test strip Use to check blood sugars once daily. Dx code 250.00.  100 each  3  . glucose blood test strip BAYER CONTOUR TEST STRIPS CHECK SUGAR ONCE DAILY  100 each  3  . Insulin Pump Accessories (ACCU-CHEK SPIRIT BATTERY KIT) MISC Use to test blood sugar once daily. Dx code 250.00  1 each  0  . Lancets (ACCU-CHEK MULTICLIX) lancets Use to test blood sugar once daily. Dx code 250.00.  100 each  3  . levothyroxine (SYNTHROID, LEVOTHROID) 50 MCG tablet Take 1 tablet (50 mcg total) by mouth daily.  90 tablet  3  . losartan-hydrochlorothiazide (HYZAAR) 100-25 MG per tablet Take 1 tablet by mouth daily.  90 tablet  3  . meloxicam (MOBIC) 15 MG tablet Take 1 tablet (15 mg total) by mouth daily.  30 tablet  0  . metFORMIN (GLUCOPHAGE) 500 MG tablet Take 1 tablet (500 mg total) by mouth 3 (three) times daily.  270 tablet  3  . metoprolol succinate (TOPROL-XL) 50 MG 24 hr tablet 1.5 tablets daily  135 tablet  3  . Multiple Vitamin (MULTIVITAMINS PO) Take by mouth.      Marland Kitchen omeprazole (PRILOSEC) 20 MG capsule Take 1 capsule (20 mg total) by mouth daily.  30 capsule  3  .  PRESCRIPTION MEDICATION T/S Bayer Contour Test Strips 100      . simvastatin (ZOCOR) 20 MG tablet Take 1 tablet (20 mg total) by mouth every evening.  90 tablet  3  . solifenacin (VESICARE) 5 MG tablet Take 1 tablet (5 mg total) by mouth daily.  90 tablet  3   No current facility-administered medications on file prior to visit.   History   Social History  . Marital Status: Married    Spouse Name: Grayce Sessions    Number of Children: 3  . Years of Education: 12   Occupational History  . retired     2008   Social History Main Topics  . Smoking status: Never Smoker   . Smokeless tobacco: Not on file  . Alcohol Use: No  . Drug Use: No  . Sexual Activity: Not on file   Other Topics Concern  . Not on  file   Social History Narrative   Marital status:  Married since 2007,second marriage, first husband died 05-13-2002 (51 years of marriage). Happily married;no abuse.      Lives: with husband.      Children:  3 children (60,51,41) son in [place], 2 daughters in [place];  5 grandchildren, 5 gg.        Tobacco: none       Alcohol: none        Exercise: Walking 45-60 minutes. 3 x week; exercise program with church.       Living Will:  Patient DOES have Living Will; +CPR but DNI.      ADLs:   Independent with all ADL's; drives.  No assistant devices.  No falls in 05-12-2012.        Caffeine:  Consumes a minimal amount of caffeine.      Home safety:  Smoke alarms and carbon monoxide detector in the home.       Seatbelts:  Always uses seat belts.      Organ donor:  No.       Guns:  Guns in the home: No unsecured guns in the home.    Family History  Problem Relation Age of Onset  . Cancer Father   . Seizures Sister     Epilepsy  . Cancer Sister     breast  . Cirrhosis Brother     deceased  . Osteoporosis Mother   . Arthritis Mother   . Diabetes Sister     insulin dependent       Objective:   Physical Exam  Nursing note and vitals reviewed. Constitutional: She is oriented to person, place, and  time. She appears well-developed and well-nourished. No distress.  HENT:  Head: Normocephalic and atraumatic.  Right Ear: External ear normal.  Left Ear: External ear normal.  Nose: Nose normal.  Mouth/Throat: Oropharynx is clear and moist.  Eyes: Conjunctivae and EOM are normal. Pupils are equal, round, and reactive to light.  Neck: Normal range of motion. Neck supple. No JVD present. Carotid bruit is not present. No thyromegaly present.  Well healed horizontal scar lower anterior neck s/p thyroidectomy.  Cardiovascular: Normal rate, regular rhythm, normal heart sounds and intact distal pulses.  Exam reveals no gallop and no friction rub.   No murmur heard. Pulmonary/Chest: Effort normal and breath sounds normal. She has no wheezes. She has no rales.  Abdominal: Soft. Bowel sounds are normal. She exhibits no distension and no mass. There is no tenderness. There is no rebound and no guarding.  Genitourinary: Rectum normal and vagina normal. No breast swelling, tenderness, discharge or bleeding. Pelvic exam was performed with patient supine. There is no rash, tenderness or lesion on the right labia. There is no rash, tenderness or lesion on the left labia. Right adnexum displays no mass, no tenderness and no fullness. Left adnexum displays no mass, no tenderness and no fullness.  Scant urine in introitus.  Musculoskeletal:       Right shoulder: Normal.       Left shoulder: Normal.       Cervical back: Normal.  Lymphadenopathy:    She has no cervical adenopathy.  Neurological: She is alert and oriented to person, place, and time. No cranial nerve deficit. She exhibits normal muscle tone. Coordination normal.  Skin: Skin is warm and dry. No rash noted. She is not diaphoretic. No erythema. No pallor.  Psychiatric: She has a normal mood and affect. Her behavior is normal.  Judgment and thought content normal.   Results for orders placed in visit on 04/06/13  POCT UA - MICROSCOPIC ONLY       Result Value Range   WBC, Ur, HPF, POC 6-7     RBC, urine, microscopic neg     Bacteria, U Microscopic 3+     Mucus, UA pos     Epithelial cells, urine per micros 4-6     Crystals, Ur, HPF, POC neg     Casts, Ur, LPF, POC neg     Yeast, UA neg    POCT URINALYSIS DIPSTICK      Result Value Range   Color, UA yellow     Clarity, UA clear     Glucose, UA neg     Bilirubin, UA neg     Ketones, UA neg     Spec Grav, UA 1.010     Blood, UA neg     pH, UA 6.0     Protein, UA neg     Urobilinogen, UA 0.2     Nitrite, UA pos     Leukocytes, UA Trace     EKG: NSR; ST changes. [facility] reading (PRIMARY) by  Dr. Tamala Julian.  LUMBAR SPINE: old compression fracture T11; spurring; DDD      Assessment & Plan:  Routine general medical examination at a health care facility - Plan: CBC with Differential, COMPLETE METABOLIC PANEL WITH GFR, Hemoglobin A1c, Lipid panel, TSH, POCT UA - Microscopic Only, POCT urinalysis dipstick, Microalbumin, urine, EKG 12-Lead, IFOBT POC (occult bld, rslt in office)  Essential hypertension, benign - Plan: CBC with Differential, COMPLETE METABOLIC PANEL WITH GFR  Pure hypercholesterolemia - Plan: Lipid panel  Type II or unspecified type diabetes mellitus without mention of complication, not stated as uncontrolled - Plan: Hemoglobin A1c  Unspecified hypothyroidism - Plan: TSH  Breast cancer screening - Plan: MM Digital Screening  Osteoporosis, unspecified - Plan: DG Bone Density  Low back pain - Plan: DG Lumbar Spine Complete  1. Annual Wellness Examination: anticipatory guidance provided.  No longer warrants pap smears due to age.  S/p pelvic exam in office. Refer for mammogram.  Colonoscopy due yet patient has not been notified regarding repeat colonoscopy; I will contact GI to clarify; hemosure obtained in office. Immunizations reviewed; pt declined Zostavax; history of shingles.  Low fall risk. No evidence of depression.  No hearing loss. Independent with all ADLs.   Has living will; fine with CPR but DNI; family aware of patient's wishes. 2.  Breast cancer screening: refer for mammogram. 3.  Osteoporosis: stable with Fosamax, exercise, Calcium supplementation.  Repeat bone density scan. 4.  DMII: controlled; obtain labs; refills of medications provided. 5. HTN: controlled; obtain labs, u/a, EKG.  Refills provided. 6. Hyperlipidemia: controlled; obtain labs; refills provided. 7. Hypothyroidism: controlled; obtain labs; refill provided. 8. Low back pain: new onset in past year; DDD lumbar spine on xrays; old compression fracture also present and not etiology to pain; recommend home exercise program daily and provided; also recommend Mobic daily for two weeks.  If no improvement, refer to ortho and/or PT.  Meds ordered this encounter  Medications  . alendronate (FOSAMAX) 70 MG tablet    Sig: Take 1 tablet (70 mg total) by mouth every 7 (seven) days. Take with a full glass of water on an empty stomach.    Dispense:  12 tablet    Refill:  3  . amLODipine (NORVASC) 10 MG tablet    Sig: Take 1 tablet (  10 mg total) by mouth daily.    Dispense:  90 tablet    Refill:  3  . levothyroxine (SYNTHROID, LEVOTHROID) 50 MCG tablet    Sig: Take 1 tablet (50 mcg total) by mouth daily.    Dispense:  90 tablet    Refill:  3  . losartan-hydrochlorothiazide (HYZAAR) 100-25 MG per tablet    Sig: Take 1 tablet by mouth daily.    Dispense:  90 tablet    Refill:  3  . metFORMIN (GLUCOPHAGE) 500 MG tablet    Sig: Take 1 tablet (500 mg total) by mouth 3 (three) times daily.    Dispense:  270 tablet    Refill:  3  . metoprolol succinate (TOPROL-XL) 50 MG 24 hr tablet    Sig: 1.5 tablets daily    Dispense:  135 tablet    Refill:  3  . solifenacin (VESICARE) 5 MG tablet    Sig: Take 1 tablet (5 mg total) by mouth daily.    Dispense:  90 tablet    Refill:  3  . simvastatin (ZOCOR) 20 MG tablet    Sig: Take 1 tablet (20 mg total) by mouth every evening.    Dispense:  90  tablet    Refill:  3   [name], M.D.  [facility] [address] [place], [place]  [address] [phone] phone [phone] fax

## 2013-04-06 NOTE — Patient Instructions (Signed)

## 2013-04-07 ENCOUNTER — Encounter: Payer: Self-pay | Admitting: *Deleted

## 2013-04-12 ENCOUNTER — Ambulatory Visit: Payer: Self-pay | Admitting: Family Medicine

## 2013-07-13 ENCOUNTER — Ambulatory Visit: Payer: Medicare Other | Admitting: Family Medicine

## 2013-07-21 ENCOUNTER — Ambulatory Visit (INDEPENDENT_AMBULATORY_CARE_PROVIDER_SITE_OTHER): Payer: Medicare Other | Admitting: Family Medicine

## 2013-07-21 ENCOUNTER — Encounter: Payer: Self-pay | Admitting: Family Medicine

## 2013-07-21 VITALS — BP 170/72 | HR 63 | Temp 98.1°F | Resp 16 | Ht 62.5 in | Wt 186.8 lb

## 2013-07-21 DIAGNOSIS — F430 Acute stress reaction: Secondary | ICD-10-CM

## 2013-07-21 DIAGNOSIS — E7800 Pure hypercholesterolemia, unspecified: Secondary | ICD-10-CM

## 2013-07-21 DIAGNOSIS — Z23 Encounter for immunization: Secondary | ICD-10-CM

## 2013-07-21 DIAGNOSIS — E119 Type 2 diabetes mellitus without complications: Secondary | ICD-10-CM

## 2013-07-21 DIAGNOSIS — M545 Low back pain, unspecified: Secondary | ICD-10-CM

## 2013-07-21 DIAGNOSIS — J309 Allergic rhinitis, unspecified: Secondary | ICD-10-CM

## 2013-07-21 DIAGNOSIS — I10 Essential (primary) hypertension: Secondary | ICD-10-CM

## 2013-07-21 LAB — CBC WITH DIFFERENTIAL/PLATELET
BASOS ABS: 0 10*3/uL (ref 0.0–0.1)
Basophils Relative: 0 % (ref 0–1)
Eosinophils Absolute: 0.1 10*3/uL (ref 0.0–0.7)
Eosinophils Relative: 1 % (ref 0–5)
HCT: 40.1 % (ref 36.0–46.0)
HEMOGLOBIN: 13.8 g/dL (ref 12.0–15.0)
Lymphocytes Relative: 33 % (ref 12–46)
Lymphs Abs: 2.7 10*3/uL (ref 0.7–4.0)
MCH: 29.1 pg (ref 26.0–34.0)
MCHC: 34.4 g/dL (ref 30.0–36.0)
MCV: 84.4 fL (ref 78.0–100.0)
MONOS PCT: 6 % (ref 3–12)
Monocytes Absolute: 0.5 10*3/uL (ref 0.1–1.0)
NEUTROS ABS: 4.9 10*3/uL (ref 1.7–7.7)
NEUTROS PCT: 60 % (ref 43–77)
Platelets: 281 10*3/uL (ref 150–400)
RBC: 4.75 MIL/uL (ref 3.87–5.11)
RDW: 13.5 % (ref 11.5–15.5)
WBC: 8.2 10*3/uL (ref 4.0–10.5)

## 2013-07-21 LAB — COMPLETE METABOLIC PANEL WITH GFR
ALBUMIN: 4.4 g/dL (ref 3.5–5.2)
ALK PHOS: 44 U/L (ref 39–117)
ALT: 21 U/L (ref 0–35)
AST: 17 U/L (ref 0–37)
BUN: 12 mg/dL (ref 6–23)
CO2: 28 mEq/L (ref 19–32)
Calcium: 10.3 mg/dL (ref 8.4–10.5)
Chloride: 98 mEq/L (ref 96–112)
Creat: 0.61 mg/dL (ref 0.50–1.10)
GFR, Est African American: >89 mL/min
GFR, Est Non African American: 87 mL/min
GLUCOSE: 113 mg/dL — AB (ref 70–99)
POTASSIUM: 4.3 meq/L (ref 3.5–5.3)
SODIUM: 137 meq/L (ref 135–145)
TOTAL PROTEIN: 7 g/dL (ref 6.0–8.3)
Total Bilirubin: 0.8 mg/dL (ref 0.2–1.2)

## 2013-07-21 LAB — LIPID PANEL
CHOL/HDL RATIO: 2.6 ratio
Cholesterol: 128 mg/dL (ref 0–200)
HDL: 50 mg/dL (ref >39)
LDL Cholesterol: 60 mg/dL (ref 0–99)
Triglycerides: 88 mg/dL (ref <150)
VLDL: 18 mg/dL (ref 0–40)

## 2013-07-21 MED ORDER — ALENDRONATE SODIUM 70 MG PO TABS: 70.0000 mg | ORAL_TABLET | ORAL | Status: DC

## 2013-07-21 MED ORDER — OXYBUTYNIN CHLORIDE 5 MG PO TABS: 5.0000 mg | ORAL_TABLET | Freq: Three times a day (TID) | ORAL | Status: DC

## 2013-07-21 MED ORDER — AMLODIPINE BESYLATE 10 MG PO TABS: 10.0000 mg | ORAL_TABLET | Freq: Every day | ORAL | Status: DC

## 2013-07-21 MED ORDER — METOPROLOL SUCCINATE ER 50 MG PO TB24: ORAL_TABLET | ORAL | Status: DC

## 2013-07-21 MED ORDER — LOSARTAN POTASSIUM-HCTZ 100-25 MG PO TABS: 1.0000 | ORAL_TABLET | Freq: Every day | ORAL | Status: DC

## 2013-07-21 MED ORDER — SIMVASTATIN 20 MG PO TABS: 20.0000 mg | ORAL_TABLET | Freq: Every evening | ORAL | Status: DC

## 2013-07-21 MED ORDER — LEVOTHYROXINE SODIUM 50 MCG PO TABS: 50.0000 ug | ORAL_TABLET | Freq: Every day | ORAL | Status: DC

## 2013-07-21 MED ORDER — METFORMIN HCL 500 MG PO TABS: 500.0000 mg | ORAL_TABLET | Freq: Three times a day (TID) | ORAL | Status: DC

## 2013-07-21 NOTE — Progress Notes (Signed)
Patient ID: Kara Rogers, female   DOB: Jan 18, 1932, 80 y.o.   MRN: [id number]  This chart was scribed for Wardell Honour, MD by Eston Mould, ED Scribe. This patient was seen in room Room/bed 29 and the patient's care was started at 12:07 PM. Subjective:    Patient ID: Kara Rogers, female    DOB: Mar 05, 1931, 80 y.o.   MRN: [id number]  07/21/2013  Follow-up  HPI Kara Rogers is a 80 y.o. female who presents to the [facility] for 3 month F/U of DM, HTN, and hyperlipidemia. Pt was also having low back pain during last visit. Pt states she has not been able to talk this morning due to being hoarse. She suspects this is due to her seasonal allergies and reports having intermittent sneezing and coughing with postnasal drip.  States she still has episodes of back pain still. States she has occasional heart burn. Denies CP, SOB, fevers, chills, night sweat, ear pain, rhinorrhea, congestion, sore throat or feeling ill at this moment.  States she has been checking her BP while at home. States her BP is generally 140. States her BP is generally elevated when she wakes up; states it can range from165-170. Pt states she takes her medications in the morning. Denies checking her glucose levels while at home and states she has been monitoring her foods. Physical was done during last visit. States her mammogram was done at [facility] and states the findings WNL.  Pt states she has been ok for the past 3 months. States her husband recently had pneumonia and has been weak; her 75-yo son was diagnosed with lung cancer. States her husband has had pneumonia twice in the past few months. She feels things have been happening all at once and states she is coping with all her issues as well as she can.  Review of Systems  Constitutional: Negative for fever, chills, diaphoresis and fatigue.  HENT: Positive for postnasal drip, sneezing and voice change. Negative for ear pain.   Eyes: Negative for visual disturbance.   Respiratory: Positive for cough. Negative for shortness of breath.   Cardiovascular: Negative for chest pain, palpitations and leg swelling.  Gastrointestinal: Negative for nausea, vomiting, abdominal pain, diarrhea and constipation.  Endocrine: Negative for cold intolerance, heat intolerance, polydipsia, polyphagia and polyuria.  Musculoskeletal: Positive for back pain.  Neurological: Positive for headaches. Negative for dizziness, tremors, seizures, syncope, facial asymmetry, speech difficulty, weakness, light-headedness and numbness.  All other systems reviewed and are negative.   Past Medical History  Diagnosis Date  . Essential hypertension, benign   . Pure hypercholesterolemia   . Type II or unspecified type diabetes mellitus without mention of complication, not stated as uncontrolled   . Osteoporosis   . Leg cramps   . Unspecified disorder of skin and subcutaneous tissue   . Unspecified hypothyroidism   . Unspecified vitamin D deficiency   . Personal history of colonic polyps   . Hypercalcemia   . Chicken pox   . Measles   . Mumps    No Known Allergies Current Outpatient Prescriptions  Medication Sig Dispense Refill  . Ascorbic Acid (VITAMIN C) 1000 MG tablet Take 1,000 mg by mouth daily.    Marland Kitchen aspirin 81 MG tablet Take 81 mg by mouth daily.    . Blood Glucose Calibration (ACCU-CHEK AVIVA) SOLN Use to test blood sugar once daily. Dx code 250.00. 1 each 0  . cholecalciferol (VITAMIN D) 1000 UNITS tablet Take 1,000 Units by mouth daily.    Marland Kitchen  Insulin Pump Accessories (ACCU-CHEK SPIRIT BATTERY KIT) MISC Use to test blood sugar once daily. Dx code 250.00 1 each 0  . meloxicam (MOBIC) 15 MG tablet Take 1 tablet (15 mg total) by mouth daily. 30 tablet 0  . Multiple Vitamin (MULTIVITAMINS PO) Take by mouth.    Marland Kitchen PRESCRIPTION MEDICATION T/S Bayer Contour Test Strips 100    . solifenacin (VESICARE) 5 MG tablet Take 1 tablet (5 mg total) by mouth daily. 90 tablet 3  . alendronate  (FOSAMAX) 70 MG tablet Take 1 tablet (70 mg total) by mouth every 7 (seven) days. Take with a full glass of water on an empty stomach. 12 tablet 3  . amLODipine (NORVASC) 10 MG tablet Take 1 tablet (10 mg total) by mouth daily. 90 tablet 1  . Blood Glucose Monitoring Suppl (BLOOD GLUCOSE METER KIT AND SUPPLIES) KIT Test blood sugar daily. Dx code: 250.00 1 each 0  . citalopram (CELEXA) 10 MG tablet Take 1 tablet (10 mg total) by mouth daily. 90 tablet 0  . glucose blood test strip Test blood sugar daily. Dx code: 250.00 100 each 3  . Lancets MISC Test blood sugar daily. Dx code: 250.00 100 each 3  . levothyroxine (SYNTHROID, LEVOTHROID) 50 MCG tablet Take 1 tablet (50 mcg total) by mouth daily. 90 tablet 1  . losartan-hydrochlorothiazide (HYZAAR) 100-25 MG per tablet Take 1 tablet by mouth daily. 90 tablet 1  . metFORMIN (GLUCOPHAGE) 500 MG tablet Take 1 tablet (500 mg total) by mouth 3 (three) times daily. 270 tablet 1  . metoprolol succinate (TOPROL-XL) 50 MG 24 hr tablet 1.5 tablets daily 135 tablet 1  . omeprazole (PRILOSEC) 20 MG capsule Take 1 capsule (20 mg total) by mouth daily. 90 capsule 1  . oxybutynin (DITROPAN) 5 MG tablet Take 1 tablet (5 mg total) by mouth 3 (three) times daily. 270 tablet 3  . simvastatin (ZOCOR) 20 MG tablet Take 1 tablet (20 mg total) by mouth every evening. 90 tablet 1  . zoster vaccine live, PF, (ZOSTAVAX) 91478 UNT/0.65ML injection Inject 19,400 Units into the skin once. 0.65 mL 0   No current facility-administered medications for this visit.   Objective:  Triage Vitals:BP 170/72  Pulse 63  Temp(Src) 98.1 F (36.7 C) (Oral)  Resp 16  Ht 5' 2.5" (1.588 m)  Wt 186 lb 12.8 oz (84.732 kg)  BMI 33.60 kg/m2  SpO2 97%  BP during exam:180/78  Physical Exam  Constitutional: She is oriented to person, place, and time. She appears well-developed and well-nourished. No distress.  HENT:  Head: Normocephalic and atraumatic.  Right Ear: External ear normal.    Left Ear: External ear normal.  Nose: Nose normal.  Mouth/Throat: Oropharynx is clear and moist.  Eyes: Conjunctivae and EOM are normal. Pupils are equal, round, and reactive to light.  Neck: Normal range of motion. Neck supple. Carotid bruit is not present. No tracheal deviation present. No thyromegaly present.  Cardiovascular: Normal rate, regular rhythm, normal heart sounds and intact distal pulses.  Exam reveals no gallop and no friction rub.   No murmur heard. Pulmonary/Chest: Effort normal and breath sounds normal. No respiratory distress. She has no wheezes. She has no rales.  Abdominal: Soft. Bowel sounds are normal. She exhibits no distension and no mass. There is no tenderness. There is no rebound and no guarding.  Musculoskeletal: Normal range of motion.       Lumbar back: She exhibits normal range of motion, no tenderness, no bony tenderness, no  pain and no spasm.  Full ROM of lumbar spine.  Lymphadenopathy:    She has no cervical adenopathy.  Neurological: She is alert and oriented to person, place, and time. No cranial nerve deficit.  Skin: Skin is warm and dry. No rash noted. She is not diaphoretic. No erythema. No pallor.  Psychiatric: She has a normal mood and affect. Her behavior is normal.  Nursing note and vitals reviewed.   Results for orders placed or performed in visit on 07/21/13  CBC with Differential  Result Value Ref Range   WBC 8.2 4.0 - 10.5 K/uL   RBC 4.75 3.87 - 5.11 MIL/uL   Hemoglobin 13.8 12.0 - 15.0 g/dL   HCT 40.1 36.0 - 46.0 %   MCV 84.4 78.0 - 100.0 fL   MCH 29.1 26.0 - 34.0 pg   MCHC 34.4 30.0 - 36.0 g/dL   RDW 13.5 11.5 - 15.5 %   Platelets 281 150 - 400 K/uL   Neutrophils Relative % 60 43 - 77 %   Neutro Abs 4.9 1.7 - 7.7 K/uL   Lymphocytes Relative 33 12 - 46 %   Lymphs Abs 2.7 0.7 - 4.0 K/uL   Monocytes Relative 6 3 - 12 %   Monocytes Absolute 0.5 0.1 - 1.0 K/uL   Eosinophils Relative 1 0 - 5 %   Eosinophils Absolute 0.1 0.0 - 0.7  K/uL   Basophils Relative 0 0 - 1 %   Basophils Absolute 0.0 0.0 - 0.1 K/uL   Smear Review Criteria for review not met   COMPLETE METABOLIC PANEL WITH GFR  Result Value Ref Range   Sodium 137 135 - 145 mEq/L   Potassium 4.3 3.5 - 5.3 mEq/L   Chloride 98 96 - 112 mEq/L   CO2 28 19 - 32 mEq/L   Glucose, Bld 113 (H) 70 - 99 mg/dL   BUN 12 6 - 23 mg/dL   Creat 0.61 0.50 - 1.10 mg/dL   Total Bilirubin 0.8 0.2 - 1.2 mg/dL   Alkaline Phosphatase 44 39 - 117 U/L   AST 17 0 - 37 U/L   ALT 21 0 - 35 U/L   Total Protein 7.0 6.0 - 8.3 g/dL   Albumin 4.4 3.5 - 5.2 g/dL   Calcium 10.3 8.4 - 10.5 mg/dL   GFR, Est African American >89 mL/min   GFR, Est Non African American 87 mL/min  Hemoglobin A1c  Result Value Ref Range   Hgb A1c MFr Bld 5.9 (H) <5.7 %   Mean Plasma Glucose 123 (H) <117 mg/dL  Lipid panel  Result Value Ref Range   Cholesterol 128 0 - 200 mg/dL   Triglycerides 88 <150 mg/dL   HDL 50 >39 mg/dL   Total CHOL/HDL Ratio 2.6 Ratio   VLDL 18 0 - 40 mg/dL   LDL Cholesterol 60 0 - 99 mg/dL   Assessment & Plan:  Type II or unspecified type diabetes mellitus without mention of complication, not stated as uncontrolled  Essential hypertension, benign  Pure hypercholesterolemia  Allergic rhinitis, cause unspecified  Need for prophylactic vaccination against Streptococcus pneumoniae (pneumococcus)  Acute stress reaction  Midline low back pain without sciatica    1. DMII: controlled; obtain labs; refills provided. 2.  HTN: moderately controlled; obtain labs; refills provided; recommend switching some BP medications to qhs use to improve morning readings. 3.  Hypercholesterolemia: controlled; obtain labs; continue current medications. 4.  Allergic Rhinitis: worsening.  Recommend oral antihistamine and Flonase daily. 5.  S/p  Prevnar-13. 6.  Lower back pain: persistent but improving; recommend supportive care with rest, stretches, frequent ambulation. Pt does not desire  referral to ortho at this time.  Advised to contact office if desires referral.  S/p xrays lumbar spine 04/2013 with T12 compression fracture that is new since 2011; moving well.  If pain secondary to compression fracture, no further intervention warranted at this time.  7.   Stress reaction:  New.  Husband with pneumonia twice in past few months ;son with recent diagnosis of lung cancer; counseling provided; coping well; good family support.     Meds ordered this encounter  Medications  . DISCONTD: losartan-hydrochlorothiazide (HYZAAR) 100-25 MG per tablet    Sig: Take 1 tablet by mouth daily.    Dispense:  90 tablet    Refill:  3  . DISCONTD: metFORMIN (GLUCOPHAGE) 500 MG tablet    Sig: Take 1 tablet (500 mg total) by mouth 3 (three) times daily.    Dispense:  270 tablet    Refill:  3  . DISCONTD: levothyroxine (SYNTHROID, LEVOTHROID) 50 MCG tablet    Sig: Take 1 tablet (50 mcg total) by mouth daily.    Dispense:  90 tablet    Refill:  3  . DISCONTD: amLODipine (NORVASC) 10 MG tablet    Sig: Take 1 tablet (10 mg total) by mouth daily.    Dispense:  90 tablet    Refill:  3  . DISCONTD: alendronate (FOSAMAX) 70 MG tablet    Sig: Take 1 tablet (70 mg total) by mouth every 7 (seven) days. Take with a full glass of water on an empty stomach.    Dispense:  12 tablet    Refill:  3  . DISCONTD: metoprolol succinate (TOPROL-XL) 50 MG 24 hr tablet    Sig: 1.5 tablets daily    Dispense:  135 tablet    Refill:  3  . DISCONTD: simvastatin (ZOCOR) 20 MG tablet    Sig: Take 1 tablet (20 mg total) by mouth every evening.    Dispense:  90 tablet    Refill:  3  . oxybutynin (DITROPAN) 5 MG tablet    Sig: Take 1 tablet (5 mg total) by mouth 3 (three) times daily.    Dispense:  270 tablet    Refill:  3    Return in about 3 months (around 10/21/2013) for recheck.  I personally performed the services described in this documentation, which was scribed in my presence.  The recorded information has  been reviewed and is accurate.  [name], M.D.  [facility] [address] [place], [place]  [address] [phone] phone [phone] fax

## 2013-07-21 NOTE — Patient Instructions (Addendum)
1.  PURCHASE CLARITIN/LORATADINE 10MG  ONE TABLET DAILY FOR ALLERGIES, HOARSENESS/LARYNGITIS.   2. CHECK BLOOD PRESSURE ONE TIME DAILY FOR THE NEXT TWO WEEKS.

## 2013-07-22 LAB — HEMOGLOBIN A1C
Hgb A1c MFr Bld: 5.9 % — ABNORMAL HIGH (ref <5.7)
Mean Plasma Glucose: 123 mg/dL — ABNORMAL HIGH (ref <117)

## 2013-10-25 ENCOUNTER — Ambulatory Visit: Payer: Medicare Other | Admitting: Family Medicine

## 2013-11-03 ENCOUNTER — Encounter: Payer: Self-pay | Admitting: Family Medicine

## 2013-11-03 ENCOUNTER — Ambulatory Visit (INDEPENDENT_AMBULATORY_CARE_PROVIDER_SITE_OTHER): Payer: Medicare Other | Admitting: Family Medicine

## 2013-11-03 ENCOUNTER — Ambulatory Visit (INDEPENDENT_AMBULATORY_CARE_PROVIDER_SITE_OTHER): Payer: Medicare Other

## 2013-11-03 VITALS — BP 130/72 | HR 54 | Temp 97.6°F | Resp 16 | Ht 62.5 in | Wt 178.4 lb

## 2013-11-03 DIAGNOSIS — E7800 Pure hypercholesterolemia, unspecified: Secondary | ICD-10-CM

## 2013-11-03 DIAGNOSIS — M79641 Pain in right hand: Secondary | ICD-10-CM

## 2013-11-03 DIAGNOSIS — Z23 Encounter for immunization: Secondary | ICD-10-CM

## 2013-11-03 DIAGNOSIS — I10 Essential (primary) hypertension: Secondary | ICD-10-CM

## 2013-11-03 DIAGNOSIS — F430 Acute stress reaction: Secondary | ICD-10-CM

## 2013-11-03 DIAGNOSIS — E119 Type 2 diabetes mellitus without complications: Secondary | ICD-10-CM

## 2013-11-03 DIAGNOSIS — S62001A Unspecified fracture of navicular [scaphoid] bone of right wrist, initial encounter for closed fracture: Secondary | ICD-10-CM

## 2013-11-03 DIAGNOSIS — M79609 Pain in unspecified limb: Secondary | ICD-10-CM

## 2013-11-03 MED ORDER — CITALOPRAM HYDROBROMIDE 10 MG PO TABS: 10.0000 mg | ORAL_TABLET | Freq: Every day | ORAL | Status: DC

## 2013-11-03 MED ORDER — ZOSTER VACCINE LIVE 19400 UNT/0.65ML ~~LOC~~ SOLR: 0.6500 mL | Freq: Once | SUBCUTANEOUS | Status: DC

## 2013-11-03 NOTE — Progress Notes (Addendum)
Patient ID: Kara Rogers, female   DOB: 23-Aug-1931, 80 y.o.   MRN: [id number]   Subjective:  This chart was scribed for Kara Forts, MD by Donato Schultz, Medical Scribe. This patient was seen in Room 23 and the patient's care was started at 2:49 PM.   Patient ID: Kara Rogers, female    DOB: 03-23-1931, 80 y.o.   MRN: [id number]  11/03/2013  Follow-up, fell 2 wks ago right hand sore, Diabetes, Hyperlipidemia and Hypertension   HPI HPI Comments: [name] is a 80 y.o. female who presents to the [facility] for a 3 month follow-up.  She has been losing weight due to stress caused by her husband's medical problems.  He is currently suffering from worsening COPD and recently went to the [facility] in [place] who recommended that they conduct stem cell therapy.  She was there for three days and returned from [place] last Thursday.    She states that she has been shaky lately and fell 2 weeks ago while picking cherry tomatoes in her garden.  She landed on her back and her head bounced on the ground when she fell.  She hit her right hand at the time of the fall and states that she hit her hand.  Her right hand is still sore and there was some mild bruising to the area that is resolving.  She did not lose consciousness.  She denies dizziness and near-syncope as associated symptoms.  She is shaky 2-3 times a day due to her nerves.    She has not checked her blood sugar lately and is in need of a meter.  Her blood pressure will run around 130-135.  She has not had any low blood pressure readings.  Her pulse is in the 50s at home and does not go below that number.  She takes her Fosamax once weekly and Amlodipine every morning.  She takes vitamin C, vitamin D, Losartan, Metformin (3 daily), Metoprolol (1.5 daily), aspirin, oxybutynin (3 times daily), and her cholesterol medication.  Her last TDAP was in 2012.  She had a flu shot today.  She had a pneumonia shot Jul 21, 2013.   She has not received a shingles vaccine in the past.  She has a history of shingles.  Her last mammogram was on March 11, 2012 in [place].  She believes she may have had a repeat mammogram this year.    She had surgery on her right hand to help repair cartilage.  She will experience some mild discomfort in that hand when she picks something up since she fell. Mild swelling of hand associated with the fall.  No n/t in hand.  She will experience intermittent constipation that will become diarrhea when she finishes her bowel movement.  She does not take a stool softener.    She stopped going to her exercise classes in December of last year.    Review of Systems  Constitutional: Negative for fever, chills, diaphoresis and fatigue.  Eyes: Negative for visual disturbance.  Respiratory: Negative for cough and shortness of breath.   Cardiovascular: Negative for chest pain, palpitations and leg swelling.  Gastrointestinal: Positive for diarrhea and constipation. Negative for nausea, vomiting and abdominal pain.  Endocrine: Negative for cold intolerance, heat intolerance, polydipsia, polyphagia and polyuria.  Musculoskeletal: Positive for arthralgias and myalgias.  Allergic/Immunologic: Negative for environmental allergies.  Neurological: Negative for dizziness, tremors, seizures, syncope, facial asymmetry, speech difficulty, weakness, light-headedness, numbness and headaches.  Psychiatric/Behavioral: Positive for sleep disturbance and dysphoric mood. The patient is nervous/anxious.     Past Medical History  Diagnosis Date   Essential hypertension, benign    Pure hypercholesterolemia    Type II or unspecified type diabetes mellitus without mention of complication, not stated as uncontrolled    Osteoporosis    Leg cramps    Unspecified disorder of skin and subcutaneous tissue    Unspecified hypothyroidism    Unspecified vitamin D deficiency    Personal history of colonic polyps     Hypercalcemia    Chicken pox    Measles    Mumps    Past Surgical History  Procedure Laterality Date   Neck surgery      L neck cyst resection   Thyroid cyst excision     Hernia repair  1981   Cataract surgery  02/2011    Left; Right 2009   Breast ductal resection  02/2010    Left-[place]. Resection of thickened breast duct. Symptoms: L breast drainage hemocult +   Abdominal hysterectomy  1980    R Ovary intact.  DUB.   Cholecystectomy     No Known Allergies Current Outpatient Prescriptions  Medication Sig Dispense Refill   alendronate (FOSAMAX) 70 MG tablet Take 1 tablet (70 mg total) by mouth every 7 (seven) days. Take with a full glass of water on an empty stomach.  12 tablet  3   amLODipine (NORVASC) 10 MG tablet Take 1 tablet (10 mg total) by mouth daily.  90 tablet  3   Ascorbic Acid (VITAMIN C) 1000 MG tablet Take 1,000 mg by mouth daily.       aspirin 81 MG tablet Take 81 mg by mouth daily.       cholecalciferol (VITAMIN D) 1000 UNITS tablet Take 1,000 Units by mouth daily.       levothyroxine (SYNTHROID, LEVOTHROID) 50 MCG tablet Take 1 tablet (50 mcg total) by mouth daily.  90 tablet  3   losartan-hydrochlorothiazide (HYZAAR) 100-25 MG per tablet Take 1 tablet by mouth daily.  90 tablet  3   metFORMIN (GLUCOPHAGE) 500 MG tablet Take 1 tablet (500 mg total) by mouth 3 (three) times daily.  270 tablet  3   metoprolol succinate (TOPROL-XL) 50 MG 24 hr tablet 1.5 tablets daily  135 tablet  3   Multiple Vitamin (MULTIVITAMINS PO) Take by mouth.       oxybutynin (DITROPAN) 5 MG tablet Take 1 tablet (5 mg total) by mouth 3 (three) times daily.  270 tablet  3   PRESCRIPTION MEDICATION T/S Bayer Contour Test Strips 100       simvastatin (ZOCOR) 20 MG tablet Take 1 tablet (20 mg total) by mouth every evening.  90 tablet  3   Blood Glucose Calibration (ACCU-CHEK AVIVA) SOLN Use to test blood sugar once daily. Dx code 250.00.  1 each  0   Blood Glucose  Monitoring Suppl (BLOOD GLUCOSE METER KIT AND SUPPLIES) KIT Test blood sugar daily. Dx code: 250.00  1 each  0   citalopram (CELEXA) 10 MG tablet Take 1 tablet (10 mg total) by mouth daily.  30 tablet  2   glucose blood test strip Test blood sugar daily. Dx code: 250.00  100 each  3   Insulin Pump Accessories (ACCU-CHEK SPIRIT BATTERY KIT) MISC Use to test blood sugar once daily. Dx code 250.00  1 each  0   Lancets MISC Test blood sugar daily. Dx code: 250.00  100  each  3   meloxicam (MOBIC) 15 MG tablet Take 1 tablet (15 mg total) by mouth daily.  30 tablet  0   omeprazole (PRILOSEC) 20 MG capsule Take 1 capsule (20 mg total) by mouth daily.  30 capsule  3   solifenacin (VESICARE) 5 MG tablet Take 1 tablet (5 mg total) by mouth daily.  90 tablet  3   zoster vaccine live, PF, (ZOSTAVAX) 03009 UNT/0.65ML injection Inject 19,400 Units into the skin once.  0.65 mL  0   No current facility-administered medications for this visit.     Objective:   BP 130/72   Pulse 54   Temp(Src) 97.6 F (36.4 C) (Oral)   Resp 16   Ht 5' 2.5" (1.588 m)   Wt 178 lb 6.4 oz (80.922 kg)   BMI 32.09 kg/m2   SpO2 98%  Physical Exam  Nursing note and vitals reviewed. Constitutional: She is oriented to person, place, and time. She appears well-developed and well-nourished. No distress.  HENT:  Head: Normocephalic and atraumatic.  Right Ear: External ear normal.  Left Ear: External ear normal.  Nose: Nose normal.  Mouth/Throat: Oropharynx is clear and moist. No oropharyngeal exudate.  Eyes: Conjunctivae and EOM are normal. Pupils are equal, round, and reactive to light.  Neck: Normal range of motion. Neck supple. Carotid bruit is not present. No thyromegaly present.  Cardiovascular: Normal rate, regular rhythm, normal heart sounds and intact distal pulses.  Exam reveals no gallop and no friction rub.   No murmur heard. Pulmonary/Chest: Effort normal and breath sounds normal. No respiratory distress. She has  no wheezes. She has no rales.  Abdominal: Soft. Bowel sounds are normal. She exhibits no distension and no mass. There is no tenderness. There is no rebound and no guarding.  Musculoskeletal: Normal range of motion. She exhibits edema.       Right elbow: Normal.She exhibits normal range of motion, no swelling and no deformity. No tenderness found. No radial head, no medial epicondyle, no lateral epicondyle and no olecranon process tenderness noted.       Right wrist: She exhibits normal range of motion, no tenderness, no bony tenderness, no swelling and no deformity.       Right hand: She exhibits normal range of motion, no tenderness, no bony tenderness, normal capillary refill and no deformity. Normal sensation noted. Normal strength noted.  Right hand non-tender to palpation.  Full range of motion.  Right wrist with full range motion.  Right thumb with full range of motion.   No snuffbox tenderness.  Lymphadenopathy:    She has no cervical adenopathy.  Neurological: She is alert and oriented to person, place, and time. No cranial nerve deficit.  Skin: Skin is warm and dry. No rash noted. She is not diaphoretic. No erythema. No pallor.  Psychiatric: She has a normal mood and affect. Her behavior is normal.   Results for orders placed in visit on 11/03/13  CBC WITH DIFFERENTIAL      Result Value Ref Range   WBC 8.6  4.0 - 10.5 K/uL   RBC 4.64  3.87 - 5.11 MIL/uL   Hemoglobin 13.4  12.0 - 15.0 g/dL   HCT 39.8  36.0 - 46.0 %   MCV 85.8  78.0 - 100.0 fL   MCH 28.9  26.0 - 34.0 pg   MCHC 33.7  30.0 - 36.0 g/dL   RDW 13.7  11.5 - 15.5 %   Platelets 281  150 - 400 K/uL  Neutrophils Relative % 59  43 - 77 %   Neutro Abs 5.1  1.7 - 7.7 K/uL   Lymphocytes Relative 33  12 - 46 %   Lymphs Abs 2.8  0.7 - 4.0 K/uL   Monocytes Relative 7  3 - 12 %   Monocytes Absolute 0.6  0.1 - 1.0 K/uL   Eosinophils Relative 1  0 - 5 %   Eosinophils Absolute 0.1  0.0 - 0.7 K/uL   Basophils Relative 0  0 - 1 %     Basophils Absolute 0.0  0.0 - 0.1 K/uL   Smear Review Criteria for review not met    COMPLETE METABOLIC PANEL WITH GFR      Result Value Ref Range   Sodium 138  135 - 145 mEq/L   Potassium 4.0  3.5 - 5.3 mEq/L   Chloride 100  96 - 112 mEq/L   CO2 28  19 - 32 mEq/L   Glucose, Bld 112 (*) 70 - 99 mg/dL   BUN 17  6 - 23 mg/dL   Creat 6.00  4.59 - 9.77 mg/dL   Total Bilirubin 0.8  0.2 - 1.2 mg/dL   Alkaline Phosphatase 43  39 - 117 U/L   AST 18  0 - 37 U/L   ALT 17  0 - 35 U/L   Total Protein 6.9  6.0 - 8.3 g/dL   Albumin 4.6  3.5 - 5.2 g/dL   Calcium 9.8  8.4 - 41.4 mg/dL   GFR, Est African American 87     GFR, Est Non African American 76    HEMOGLOBIN A1C      Result Value Ref Range   Hemoglobin A1C 5.9 (*) <5.7 %   Mean Plasma Glucose 123 (*) <117 mg/dL   [facility] preliminary x-ray report read by Dr. Katrinka Blazing:   R HAND:  DIFFUSE ARTHRITIS CHANGES; NO ACUTE PROCESS.  INFLUENZA VACCINE ADMINISTERED.  Assessment & Plan:   1. Type II or unspecified type diabetes mellitus without mention of complication, not stated as uncontrolled   2. Pure hypercholesterolemia   3. Essential hypertension, benign   4. Need for prophylactic vaccination and inoculation against influenza   5. Need for shingles vaccine   6. Pain of right hand   7. Acute stress reaction    1. DMII: controlled; obtain labs; decrease Metformin to one tablet twice daily.  Want to avoid hypoglycemia in elderly. 2.  Hypercholesterolemia: well controlled; no change to management. 3.  HTN: controlled; no change to medication at this time; obtain labs. 4.  Pain R hand/contusion R hand:  New.  Xray with scaphoid changes which I suspect are old due to possible old injury; no snuffbox tenderness on exam.   5.  Rx for Zostavax provided; advised to contact insurance regarding coverage and also regarding appropriate location to receive vaccine. 6.  S/p flu vaccine in office. 7. Acute stress reaction: New.  Husband with declining  health and advancing COPD.  Rx for Celexa provided to start if needed.  Meds ordered this encounter  Medications   zoster vaccine live, PF, (ZOSTAVAX) 23953 UNT/0.65ML injection    Sig: Inject 19,400 Units into the skin once.    Dispense:  0.65 mL    Refill:  0   citalopram (CELEXA) 10 MG tablet    Sig: Take 1 tablet (10 mg total) by mouth daily.    Dispense:  30 tablet    Refill:  2    Return in about 3  months (around 02/02/2014) for recheck.   I personally performed the services described in this documentation, which was scribed in my presence.  The recorded information has been reviewed and is accurate.  Kara Rogers, M.D.  [facility] [address] [place], [place]  [address] [phone] phone [phone] fax

## 2013-11-03 NOTE — Patient Instructions (Signed)
1. CALL INSURANCE COMPANY TO SEE IF THEY PAY FOR SHINGLES VACCINE. 2.  ALSO SEE WHERE YOUR INSURANCE WANTS YOU TO RECEIVE SHINGLES VACCINES (PHARMACY VERSUS DOCTOR'S OFFICE).

## 2013-11-04 LAB — COMPLETE METABOLIC PANEL WITH GFR
ALBUMIN: 4.6 g/dL (ref 3.5–5.2)
ALT: 17 U/L (ref 0–35)
AST: 18 U/L (ref 0–37)
Alkaline Phosphatase: 43 U/L (ref 39–117)
BUN: 17 mg/dL (ref 6–23)
CALCIUM: 9.8 mg/dL (ref 8.4–10.5)
CHLORIDE: 100 meq/L (ref 96–112)
CO2: 28 meq/L (ref 19–32)
Creat: 0.75 mg/dL (ref 0.50–1.10)
GFR, EST AFRICAN AMERICAN: 87 mL/min
GFR, Est Non African American: 76 mL/min
Glucose, Bld: 112 mg/dL — ABNORMAL HIGH (ref 70–99)
POTASSIUM: 4 meq/L (ref 3.5–5.3)
Sodium: 138 mEq/L (ref 135–145)
TOTAL PROTEIN: 6.9 g/dL (ref 6.0–8.3)
Total Bilirubin: 0.8 mg/dL (ref 0.2–1.2)

## 2013-11-04 LAB — HEMOGLOBIN A1C
Hgb A1c MFr Bld: 5.9 % — ABNORMAL HIGH (ref <5.7)
Mean Plasma Glucose: 123 mg/dL — ABNORMAL HIGH (ref <117)

## 2013-11-04 LAB — CBC WITH DIFFERENTIAL/PLATELET
BASOS ABS: 0 10*3/uL (ref 0.0–0.1)
Basophils Relative: 0 % (ref 0–1)
Eosinophils Absolute: 0.1 10*3/uL (ref 0.0–0.7)
Eosinophils Relative: 1 % (ref 0–5)
HCT: 39.8 % (ref 36.0–46.0)
Hemoglobin: 13.4 g/dL (ref 12.0–15.0)
LYMPHS PCT: 33 % (ref 12–46)
Lymphs Abs: 2.8 10*3/uL (ref 0.7–4.0)
MCH: 28.9 pg (ref 26.0–34.0)
MCHC: 33.7 g/dL (ref 30.0–36.0)
MCV: 85.8 fL (ref 78.0–100.0)
Monocytes Absolute: 0.6 10*3/uL (ref 0.1–1.0)
Monocytes Relative: 7 % (ref 3–12)
NEUTROS ABS: 5.1 10*3/uL (ref 1.7–7.7)
Neutrophils Relative %: 59 % (ref 43–77)
PLATELETS: 281 10*3/uL (ref 150–400)
RBC: 4.64 MIL/uL (ref 3.87–5.11)
RDW: 13.7 % (ref 11.5–15.5)
WBC: 8.6 10*3/uL (ref 4.0–10.5)

## 2013-11-05 ENCOUNTER — Telehealth: Payer: Self-pay

## 2013-11-05 MED ORDER — GLUCOSE BLOOD VI STRP: ORAL_STRIP | Status: DC

## 2013-11-05 MED ORDER — LANCETS MISC: Status: DC

## 2013-11-05 MED ORDER — BLOOD GLUCOSE MONITOR KIT: PACK | Status: DC

## 2013-11-05 NOTE — Telephone Encounter (Signed)
Pharm called and asked for DM supplies be resent electronically w/Dx codes on them. Done.

## 2013-11-10 NOTE — Telephone Encounter (Signed)
[facility] faxed a form to complete for Medicare re: DM supplies. Completed and put in Dr Michaelle Copas box for signature.

## 2013-11-11 ENCOUNTER — Telehealth: Payer: Self-pay

## 2013-11-11 DIAGNOSIS — R059 Cough, unspecified: Secondary | ICD-10-CM

## 2013-11-11 DIAGNOSIS — R05 Cough: Secondary | ICD-10-CM

## 2013-11-11 DIAGNOSIS — I10 Essential (primary) hypertension: Secondary | ICD-10-CM

## 2013-11-11 MED ORDER — METFORMIN HCL 500 MG PO TABS: 500.0000 mg | ORAL_TABLET | Freq: Three times a day (TID) | ORAL | Status: DC

## 2013-11-11 MED ORDER — SIMVASTATIN 20 MG PO TABS: 20.0000 mg | ORAL_TABLET | Freq: Every evening | ORAL | Status: DC

## 2013-11-11 MED ORDER — LOSARTAN POTASSIUM-HCTZ 100-25 MG PO TABS: 1.0000 | ORAL_TABLET | Freq: Every day | ORAL | Status: DC

## 2013-11-11 MED ORDER — OMEPRAZOLE 20 MG PO CPDR: 20.0000 mg | DELAYED_RELEASE_CAPSULE | Freq: Every day | ORAL | Status: DC

## 2013-11-11 MED ORDER — LEVOTHYROXINE SODIUM 50 MCG PO TABS: 50.0000 ug | ORAL_TABLET | Freq: Every day | ORAL | Status: DC

## 2013-11-11 MED ORDER — AMLODIPINE BESYLATE 10 MG PO TABS: 10.0000 mg | ORAL_TABLET | Freq: Every day | ORAL | Status: DC

## 2013-11-11 MED ORDER — METOPROLOL SUCCINATE ER 50 MG PO TB24: ORAL_TABLET | ORAL | Status: DC

## 2013-11-11 MED ORDER — ALENDRONATE SODIUM 70 MG PO TABS: 70.0000 mg | ORAL_TABLET | ORAL | Status: DC

## 2013-11-11 NOTE — Telephone Encounter (Signed)
Sent in 6 month supply per protocol. Pt advised.

## 2013-11-11 NOTE — Telephone Encounter (Signed)
Dr Katrinka Blazing   MED REFILLS FOR amLODipine (NORVASC) 10 MG tablet alendronate (FOSAMAX) 70 MG tablet levothyroxine (SYNTHROID, LEVOTHROID) 50 MCG tablet losartan-hydrochlorothiazide (HYZAAR) 100-25 MG per tablet metFORMIN (GLUCOPHAGE) 500 MG tablet metoprolol succinate (TOPROL-XL) 50 MG 24 hr tablet omeprazole (PRILOSEC) 20 MG capsule simvastatin (ZOCOR) 20 MG tablet Pharmacy: [facility] [place], [place]   [phone] (H)

## 2013-11-18 ENCOUNTER — Other Ambulatory Visit: Payer: Self-pay | Admitting: Radiology

## 2013-11-18 NOTE — Telephone Encounter (Signed)
Received fax from pharmacy 11/12/13 for prescription refill. Pended medication- ordered 11/03/13 to [facility]. [facility] needs script sent to them- pharmacy updated.

## 2013-11-18 NOTE — Telephone Encounter (Signed)
[facility] called for a mail order refill of Citalopram  1 po qd. Order # [id number].  Phone number [phone].  (Dr Smith's patient)

## 2013-11-19 MED ORDER — CITALOPRAM HYDROBROMIDE 10 MG PO TABS: 10.0000 mg | ORAL_TABLET | Freq: Every day | ORAL | Status: DC

## 2014-02-07 ENCOUNTER — Ambulatory Visit (INDEPENDENT_AMBULATORY_CARE_PROVIDER_SITE_OTHER): Payer: Medicare Other | Admitting: Family Medicine

## 2014-02-07 ENCOUNTER — Encounter: Payer: Self-pay | Admitting: Family Medicine

## 2014-02-07 VITALS — BP 139/77 | HR 55 | Temp 97.7°F | Resp 16 | Ht 63.5 in | Wt 175.0 lb

## 2014-02-07 DIAGNOSIS — F32A Depression, unspecified: Secondary | ICD-10-CM

## 2014-02-07 DIAGNOSIS — I10 Essential (primary) hypertension: Secondary | ICD-10-CM

## 2014-02-07 DIAGNOSIS — F329 Major depressive disorder, single episode, unspecified: Secondary | ICD-10-CM

## 2014-02-07 DIAGNOSIS — E7800 Pure hypercholesterolemia, unspecified: Secondary | ICD-10-CM

## 2014-02-07 DIAGNOSIS — E119 Type 2 diabetes mellitus without complications: Secondary | ICD-10-CM

## 2014-02-07 LAB — COMPLETE METABOLIC PANEL WITH GFR
ALBUMIN: 4.4 g/dL (ref 3.5–5.2)
ALT: 15 U/L (ref 0–35)
AST: 14 U/L (ref 0–37)
Alkaline Phosphatase: 47 U/L (ref 39–117)
BUN: 14 mg/dL (ref 6–23)
CALCIUM: 10 mg/dL (ref 8.4–10.5)
CO2: 29 meq/L (ref 19–32)
Chloride: 98 mEq/L (ref 96–112)
Creat: 0.76 mg/dL (ref 0.50–1.10)
GFR, EST NON AFRICAN AMERICAN: 74 mL/min
GFR, Est African American: 86 mL/min
GLUCOSE: 126 mg/dL — AB (ref 70–99)
POTASSIUM: 4.4 meq/L (ref 3.5–5.3)
Sodium: 136 mEq/L (ref 135–145)
TOTAL PROTEIN: 7 g/dL (ref 6.0–8.3)
Total Bilirubin: 0.8 mg/dL (ref 0.2–1.2)

## 2014-02-07 LAB — CBC WITH DIFFERENTIAL/PLATELET
BASOS PCT: 0 % (ref 0–1)
Basophils Absolute: 0 10*3/uL (ref 0.0–0.1)
EOS ABS: 0.1 10*3/uL (ref 0.0–0.7)
Eosinophils Relative: 1 % (ref 0–5)
HCT: 41.3 % (ref 36.0–46.0)
Hemoglobin: 13.9 g/dL (ref 12.0–15.0)
Lymphocytes Relative: 25 % (ref 12–46)
Lymphs Abs: 2.5 10*3/uL (ref 0.7–4.0)
MCH: 28.8 pg (ref 26.0–34.0)
MCHC: 33.7 g/dL (ref 30.0–36.0)
MCV: 85.7 fL (ref 78.0–100.0)
MONO ABS: 0.7 10*3/uL (ref 0.1–1.0)
MPV: 10.5 fL (ref 9.4–12.4)
Monocytes Relative: 7 % (ref 3–12)
Neutro Abs: 6.6 10*3/uL (ref 1.7–7.7)
Neutrophils Relative %: 67 % (ref 43–77)
PLATELETS: 297 10*3/uL (ref 150–400)
RBC: 4.82 MIL/uL (ref 3.87–5.11)
RDW: 13.2 % (ref 11.5–15.5)
WBC: 9.9 10*3/uL (ref 4.0–10.5)

## 2014-02-07 LAB — LIPID PANEL
CHOL/HDL RATIO: 2.9 ratio
Cholesterol: 161 mg/dL (ref 0–200)
HDL: 56 mg/dL (ref >39)
LDL Cholesterol: 80 mg/dL (ref 0–99)
Triglycerides: 124 mg/dL (ref <150)
VLDL: 25 mg/dL (ref 0–40)

## 2014-02-07 LAB — HEMOGLOBIN A1C
HEMOGLOBIN A1C: 6.2 % — AB (ref <5.7)
MEAN PLASMA GLUCOSE: 131 mg/dL — AB (ref <117)

## 2014-02-07 MED ORDER — METOPROLOL SUCCINATE ER 50 MG PO TB24: ORAL_TABLET | ORAL | Status: DC

## 2014-02-07 MED ORDER — CITALOPRAM HYDROBROMIDE 20 MG PO TABS: 20.0000 mg | ORAL_TABLET | Freq: Every day | ORAL | Status: DC

## 2014-02-07 NOTE — Progress Notes (Signed)
MRN: [id number] DOB: 1932/02/08  Subjective:   Kara Rogers is a 80 y.o. female presenting for DM, HTN, acute stress reaction.  Acute Stress Reaction - Mrs. Kara Rogers reports that her husband passed away 2014/01/27. This was not unexpected and fortunately, patient started Celexa 68m at her last visit with Dr. STamala Julian 11/2013. Patient reports that she feels it has helped her, denies adverse effects. She also underwent counseling through hospice care with her husband. Currently, Mrs. Kara MORRISSETTEis living on her own, prepares her own meals and is able to manage on her own. She states that she has good family support, daughter and son-in-law visit her every other weekend; her late husband also has family that is checking in with the patient regularly. Unfortunately, this is her second husband to pass away, patient admits that it is difficult but having been through this before, states that she knows she can get through this.  DM - patient is checking blood sugar at home, ranges 130's am, 120's evening. Currently taking 100102mMetformin, reports 100% compliance, denies adverse effects including metallic taste, GI symptoms. Patient reports a good appetite, eats healthily, no sweets, limits heavy carbs and starchy foods. For exercise, patient walks to mailbox daily which is a good distance from her home. She denies any foot issues/concerns, vision changes.  HTN - checking BP weekly, ranges from 130'-140's. Taking 3 BP meds including losartan-HCT and metoprolol (evening dose), reports 100% compliance. Admits intermittent lower leg swelling R>L, worsened by prolonged sitting, for instance on long car rides. Denies adverse effects including dizziness, lightheadedness, lethargy, fatigue as well as chest pain, heart racing, palpitations.  Denies smoking or alcohol use.  Denies any other aggravating or relieving factors, no other questions or concerns.  Mrs. Kara TRINEas a current  medication list which includes the following prescription(s): alendronate, amlodipine, vitamin c, aspirin, accu-chek aviva, blood glucose meter kit and supplies, cholecalciferol, citalopram, glucose blood, accu-chek spirit battery kit, lancets, levothyroxine, losartan-hydrochlorothiazide, meloxicam, metformin, metoprolol succinate, multiple vitamin, omeprazole, oxybutynin, PRESCRIPTION MEDICATION, simvastatin, solifenacin, and zoster vaccine live (pf).  Mrs. Kara SAULNIERas No Known Allergies.   Mrs. Kara PELHAMhas a past medical history of Essential hypertension, benign; Pure hypercholesterolemia; Type II or unspecified type diabetes mellitus without mention of complication, not stated as uncontrolled; Osteoporosis; Leg cramps; Unspecified disorder of skin and subcutaneous tissue; Unspecified hypothyroidism; Unspecified vitamin D deficiency; Personal history of colonic polyps; Hypercalcemia; Chicken pox; Measles; and Mumps. She also  has past surgical history that includes Neck surgery; Thyroid cyst excision; Hernia repair (1981); Cataract surgery (02/2011); Breast Ductal Resection (02/2010); Abdominal hysterectomy (1980); and Cholecystectomy.  ROS As in subjective.  Objective:   Vitals: BP 139/77 mmHg  Pulse 55  Temp(Src) 97.7 F (36.5 C)  Resp 16  Ht 5' 3.5" (1.613 m)  Wt 175 lb (79.379 kg)  BMI 30.51 kg/m2  SpO2 99%  Physical Exam  Constitutional: She is oriented to person, place, and time and well-developed, well-nourished, and in no distress.  Cardiovascular: Normal rate, regular rhythm, normal heart sounds and intact distal pulses.  Exam reveals no gallop and no friction rub.   No murmur heard. Pulmonary/Chest: Effort normal and breath sounds normal. No respiratory distress. She has no wheezes. She has no rales. She exhibits no tenderness.  Abdominal: Bowel sounds are normal.  Musculoskeletal: Normal range of motion. She exhibits no tenderness. Edema: trace edema in R  lower extremity up to the ankle, no  erythema or tenderness, negative Homan sign.  Neurological: She is alert and oriented to person, place, and time.  Skin: Skin is warm and dry. No rash noted. She is not diaphoretic. No erythema.  Psychiatric: Her mood appears not anxious. Her affect is not blunt and not labile. She is not agitated. She exhibits a depressed mood. She expresses no homicidal and no suicidal ideation. She is not apathetic. She has a flat affect (but can be cheerful).   Assessment and Plan :   1. Essential hypertension, benign - well controlled, decreased Metoprolol to 18m d/t bradycardia x2 consecutive measurements, patient is asymptomatic today, monitor - continue losartan-HCT, diet and exercise as tolerated - f/u in 3 months - COMPLETE METABOLIC PANEL WITH GFR - CBC with Differential - metoprolol succinate (TOPROL-XL) 50 MG 24 hr tablet; 1 tablet daily  Dispense: 90 tablet; Refill: 3  2. Pure hypercholesterolemia - well controlled, repeat lipid panel today - continue Simvastatin  3. Type 2 diabetes mellitus without complication - well controlled, continue Metformin 1 tablet BID - f/u in 3 months - Hemoglobin A1c - HM Diabetes Foot Exam  4. Depression - likely d/t husband's condition initially, now bereavement - increase Celexa to 242m- offered therapy, patient declined, may revisit this in the future   MaJaynee EaglesPA-C [phone]/09/2013 9:30 AM

## 2014-02-07 NOTE — Patient Instructions (Signed)
1. Increase Citalopram to 20mg  daily.  Take Citalopram at bedtime. 2.  Decrease Metoprolol to 1 tablet daily.

## 2014-03-29 NOTE — Progress Notes (Signed)
Subjective:    Patient ID: Kara Rogers, female    DOB: 1931/09/27, 81 y.o.   MRN: [id number]  02/07/2014  Follow-up; Hypertension; and Diabetes   HPI This 81 y.o. female presents for three month evaluation of HTN, hypercholesterolemia, diabetes mellitus II.  1. DMII: Patient reports good compliance with medication, good tolerance to medication, and good symptom control.  Changes to management made at last visit include decreasing Metformin to one pill bid instead of tid.  Home sugars running 120s-130s.  Denies paresthesias of extremities; denies polyuria, polydipsia.  2. HTN:  Patient reports good compliance with medication, good tolerance to medication, and good symptom control.  No changes to management made at last visit.  Denies CP/palp/SOB; +chronic lower extremity edema B.  No worsening edema in past three months.  3.  Hyperlipidemia: Patient reports good compliance with medication, good tolerance to medication, and good symptom control.  No changes to management made at last visit.  Denies HA, dizziness, paresthesias, focal weakness.  4.  Depression/acute grief reaction: husband passed away 01/31/2014.  Coping overall well since his death. Started Celexa 25m daily at last visit.  Good family support.  Denies SI/HI. Not sleeping well.    Review of Systems  Constitutional: Negative for fever, chills, diaphoresis and fatigue.  Eyes: Negative for visual disturbance.  Respiratory: Negative for cough and shortness of breath.   Cardiovascular: Positive for leg swelling. Negative for chest pain and palpitations.  Gastrointestinal: Negative for nausea, vomiting, abdominal pain, diarrhea and constipation.  Endocrine: Negative for cold intolerance, heat intolerance, polydipsia, polyphagia and polyuria.  Skin: Negative for color change, pallor, rash and wound.  Neurological: Negative for dizziness, tremors, seizures, syncope, facial asymmetry, speech difficulty, weakness, light-headedness,  numbness and headaches.  Psychiatric/Behavioral: Positive for sleep disturbance and dysphoric mood. Negative for suicidal ideas and self-injury. The patient is not nervous/anxious.     Past Medical History  Diagnosis Date  . Essential hypertension, benign   . Pure hypercholesterolemia   . Type II or unspecified type diabetes mellitus without mention of complication, not stated as uncontrolled   . Osteoporosis   . Leg cramps   . Unspecified disorder of skin and subcutaneous tissue   . Unspecified hypothyroidism   . Unspecified vitamin D deficiency   . Personal history of colonic polyps   . Hypercalcemia   . Chicken pox   . Measles   . Mumps    Past Surgical History  Procedure Laterality Date  . Neck surgery      L neck cyst resection  . Thyroid cyst excision    . Hernia repair  1981  . Cataract surgery  02/2011    Left; Right 2009  . Breast ductal resection  02/2010    Left-[facility]. Resection of thickened breast duct. Symptoms: L breast drainage hemocult +  . Abdominal hysterectomy  1980    R Ovary intact.  DUB.  .Marland KitchenCholecystectomy     No Known Allergies Current Outpatient Prescriptions  Medication Sig Dispense Refill  . alendronate (FOSAMAX) 70 MG tablet Take 1 tablet (70 mg total) by mouth every 7 (seven) days. Take with a full glass of water on an empty stomach. 12 tablet 3  . amLODipine (NORVASC) 10 MG tablet Take 1 tablet (10 mg total) by mouth daily. 90 tablet 1  . Ascorbic Acid (VITAMIN C) 1000 MG tablet Take 1,000 mg by mouth daily.    .Marland Kitchenaspirin 81 MG tablet Take 81 mg by mouth daily.    .Marland Kitchen  Blood Glucose Calibration (ACCU-CHEK AVIVA) SOLN Use to test blood sugar once daily. Dx code 250.00. 1 each 0  . Blood Glucose Monitoring Suppl (BLOOD GLUCOSE METER KIT AND SUPPLIES) KIT Test blood sugar daily. Dx code: 250.00 1 each 0  . cholecalciferol (VITAMIN D) 1000 UNITS tablet Take 1,000 Units by mouth daily.    . citalopram (CELEXA) 20 MG tablet Take 1 tablet (20 mg  total) by mouth daily. 90 tablet 3  . glucose blood test strip Test blood sugar daily. Dx code: 250.00 100 each 3  . Insulin Pump Accessories (ACCU-CHEK SPIRIT BATTERY KIT) MISC Use to test blood sugar once daily. Dx code 250.00 1 each 0  . Lancets MISC Test blood sugar daily. Dx code: 250.00 100 each 3  . levothyroxine (SYNTHROID, LEVOTHROID) 50 MCG tablet Take 1 tablet (50 mcg total) by mouth daily. 90 tablet 1  . losartan-hydrochlorothiazide (HYZAAR) 100-25 MG per tablet Take 1 tablet by mouth daily. 90 tablet 1  . meloxicam (MOBIC) 15 MG tablet Take 1 tablet (15 mg total) by mouth daily. 30 tablet 0  . metFORMIN (GLUCOPHAGE) 500 MG tablet Take 1 tablet (500 mg total) by mouth 3 (three) times daily. 270 tablet 1  . metoprolol succinate (TOPROL-XL) 50 MG 24 hr tablet 1 tablet daily 90 tablet 3  . Multiple Vitamin (MULTIVITAMINS PO) Take by mouth.    Marland Kitchen omeprazole (PRILOSEC) 20 MG capsule Take 1 capsule (20 mg total) by mouth daily. 90 capsule 1  . oxybutynin (DITROPAN) 5 MG tablet Take 1 tablet (5 mg total) by mouth 3 (three) times daily. 270 tablet 3  . PRESCRIPTION MEDICATION T/S Bayer Contour Test Strips 100    . simvastatin (ZOCOR) 20 MG tablet Take 1 tablet (20 mg total) by mouth every evening. 90 tablet 1   No current facility-administered medications for this visit.       Objective:    BP 139/77 mmHg  Pulse 55  Temp(Src) 97.7 F (36.5 C)  Resp 16  Ht 5' 3.5" (1.613 m)  Wt 175 lb (79.379 kg)  BMI 30.51 kg/m2  SpO2 99% Physical Exam  Constitutional: She is oriented to person, place, and time. She appears well-developed and well-nourished. No distress.  HENT:  Head: Normocephalic and atraumatic.  Right Ear: External ear normal.  Left Ear: External ear normal.  Nose: Nose normal.  Mouth/Throat: Oropharynx is clear and moist.  Eyes: Conjunctivae and EOM are normal. Pupils are equal, round, and reactive to light.  Neck: Normal range of motion. Neck supple. Carotid bruit is  not present. No thyromegaly present.  Cardiovascular: Normal rate, regular rhythm, normal heart sounds and intact distal pulses.  Exam reveals no gallop and no friction rub.   No murmur heard. B chronic non-pitting edema 2+ B lower extremities.  Pulmonary/Chest: Effort normal and breath sounds normal. She has no wheezes. She has no rales.  Abdominal: Soft. Bowel sounds are normal. She exhibits no distension and no mass. There is no tenderness. There is no rebound and no guarding.  Musculoskeletal: She exhibits edema.  Lymphadenopathy:    She has no cervical adenopathy.  Neurological: She is alert and oriented to person, place, and time. No cranial nerve deficit.  Skin: Skin is warm and dry. No rash noted. She is not diaphoretic. No erythema. No pallor.  Psychiatric: She has a normal mood and affect. Her behavior is normal.        Assessment & Plan:   1. Essential hypertension, benign   2.  Pure hypercholesterolemia   3. Type 2 diabetes mellitus without complication   4. Depression      1. HTN: controlled; decrease Metoprolol to Metoprolol Succinate ER 104m daily due to persistent bradycardia. 2.  Hypercholesterolemia: controlled; obtain labs; continue current medications. 3.  DMII: controlled; obtain labs; continue bid Metformin. 4.  Depression: worsening due to passing of husband; increase Celexa to 235mdaily.    Meds ordered this encounter  Medications  . citalopram (CELEXA) 20 MG tablet    Sig: Take 1 tablet (20 mg total) by mouth daily.    Dispense:  90 tablet    Refill:  3  . metoprolol succinate (TOPROL-XL) 50 MG 24 hr tablet    Sig: 1 tablet daily    Dispense:  90 tablet    Refill:  3    Return in about 3 months (around 05/09/2014) for recheck.    Clois Montavon MaElayne GuerinM.D. [id number] [place]  [phone] [phone]

## 2014-05-11 ENCOUNTER — Ambulatory Visit (INDEPENDENT_AMBULATORY_CARE_PROVIDER_SITE_OTHER): Payer: Medicare Other | Admitting: Family Medicine

## 2014-05-11 ENCOUNTER — Encounter: Payer: Self-pay | Admitting: Family Medicine

## 2014-05-11 VITALS — BP 110/50 | HR 57 | Temp 97.8°F | Resp 16 | Ht 62.5 in | Wt 177.8 lb

## 2014-05-11 DIAGNOSIS — E785 Hyperlipidemia, unspecified: Secondary | ICD-10-CM

## 2014-05-11 DIAGNOSIS — I10 Essential (primary) hypertension: Secondary | ICD-10-CM

## 2014-05-11 DIAGNOSIS — E038 Other specified hypothyroidism: Secondary | ICD-10-CM | POA: Diagnosis not present

## 2014-05-11 DIAGNOSIS — F32A Depression, unspecified: Secondary | ICD-10-CM

## 2014-05-11 DIAGNOSIS — M810 Age-related osteoporosis without current pathological fracture: Secondary | ICD-10-CM

## 2014-05-11 DIAGNOSIS — E119 Type 2 diabetes mellitus without complications: Secondary | ICD-10-CM | POA: Diagnosis not present

## 2014-05-11 DIAGNOSIS — E034 Atrophy of thyroid (acquired): Secondary | ICD-10-CM

## 2014-05-11 DIAGNOSIS — F329 Major depressive disorder, single episode, unspecified: Secondary | ICD-10-CM

## 2014-05-11 DIAGNOSIS — Z1231 Encounter for screening mammogram for malignant neoplasm of breast: Secondary | ICD-10-CM | POA: Diagnosis not present

## 2014-05-11 LAB — POCT URINALYSIS DIPSTICK
BILIRUBIN UA: NEGATIVE
GLUCOSE UA: NEGATIVE
KETONES UA: NEGATIVE
Nitrite, UA: POSITIVE
Protein, UA: NEGATIVE
Spec Grav, UA: 1.015
UROBILINOGEN UA: 1
pH, UA: 6

## 2014-05-11 LAB — POCT UA - MICROSCOPIC ONLY
Casts, Ur, LPF, POC: NEGATIVE
Crystals, Ur, HPF, POC: NEGATIVE
Mucus, UA: NEGATIVE
YEAST UA: NEGATIVE

## 2014-05-11 LAB — CBC WITH DIFFERENTIAL/PLATELET
Basophils Absolute: 0 10*3/uL (ref 0.0–0.1)
Basophils Relative: 0 % (ref 0–1)
EOS PCT: 0 % (ref 0–5)
Eosinophils Absolute: 0 10*3/uL (ref 0.0–0.7)
HCT: 42.9 % (ref 36.0–46.0)
HEMOGLOBIN: 14.7 g/dL (ref 12.0–15.0)
LYMPHS ABS: 1.9 10*3/uL (ref 0.7–4.0)
Lymphocytes Relative: 21 % (ref 12–46)
MCH: 29.5 pg (ref 26.0–34.0)
MCHC: 34.3 g/dL (ref 30.0–36.0)
MCV: 86 fL (ref 78.0–100.0)
MPV: 10.1 fL (ref 8.6–12.4)
Monocytes Absolute: 0.6 10*3/uL (ref 0.1–1.0)
Monocytes Relative: 7 % (ref 3–12)
Neutro Abs: 6.6 10*3/uL (ref 1.7–7.7)
Neutrophils Relative %: 72 % (ref 43–77)
Platelets: 308 10*3/uL (ref 150–400)
RBC: 4.99 MIL/uL (ref 3.87–5.11)
RDW: 13.1 % (ref 11.5–15.5)
WBC: 9.1 10*3/uL (ref 4.0–10.5)

## 2014-05-11 LAB — HEMOGLOBIN A1C
Hgb A1c MFr Bld: 5.8 % — ABNORMAL HIGH (ref <5.7)
Mean Plasma Glucose: 120 mg/dL — ABNORMAL HIGH (ref <117)

## 2014-05-11 LAB — COMPREHENSIVE METABOLIC PANEL
ALT: 11 U/L (ref 0–35)
AST: 14 U/L (ref 0–37)
Albumin: 4.3 g/dL (ref 3.5–5.2)
Alkaline Phosphatase: 48 U/L (ref 39–117)
BILIRUBIN TOTAL: 1 mg/dL (ref 0.2–1.2)
BUN: 18 mg/dL (ref 6–23)
CALCIUM: 10.3 mg/dL (ref 8.4–10.5)
CHLORIDE: 97 meq/L (ref 96–112)
CO2: 24 mEq/L (ref 19–32)
Creat: 0.81 mg/dL (ref 0.50–1.10)
Glucose, Bld: 103 mg/dL — ABNORMAL HIGH (ref 70–99)
Potassium: 4.1 mEq/L (ref 3.5–5.3)
Sodium: 134 mEq/L — ABNORMAL LOW (ref 135–145)
TOTAL PROTEIN: 7 g/dL (ref 6.0–8.3)

## 2014-05-11 LAB — LIPID PANEL
CHOL/HDL RATIO: 2.7 ratio
CHOLESTEROL: 154 mg/dL (ref 0–200)
HDL: 58 mg/dL (ref >=46)
LDL Cholesterol: 73 mg/dL (ref 0–99)
TRIGLYCERIDES: 116 mg/dL (ref <150)
VLDL: 23 mg/dL (ref 0–40)

## 2014-05-11 LAB — MICROALBUMIN, URINE: Microalb, Ur: 1.8 mg/dL (ref <2.0)

## 2014-05-11 LAB — TSH: TSH: 0.921 u[IU]/mL (ref 0.350–4.500)

## 2014-05-11 NOTE — Patient Instructions (Signed)
1. DECREASE METOPROLOL ER 50MG  TO ONE TABLET DAILY.

## 2014-05-11 NOTE — Progress Notes (Signed)
Subjective:    Patient ID: Kara Rogers, female    DOB: 1932/01/04, 81 y.o.   MRN: [id number]  05/11/2014  Follow-up; Diabetes; hypercholesterolemia; and depression   HPI This 81 y.o. female presents for three month follow-up of the following:   1. DMII:  No changes to management made at last visit.  Patient reports good compliance with medication, good tolerance to medication, and good symptom control.   Sugars running pretty good.  Sugars go up with pasta.  Average sugar running 168 is the highest.    2. HTN:  Management changes made at last visit include decreasing Metoprolol ER  to one daily due to bradycardia.  Patient reports good compliance with medication, good tolerance to medication, and good symptom control.     Home blood pressures running really good and sometimes low.   Did not decrease Metoprolol ER  q d; currently taking bid.    3. Hyperlipidemia:  No changes to management made at last visit. Patient reports good compliance with medication, good tolerance to medication, and good symptom control.    4.  Hypothyroidism:  Patient reports good compliance with medication, good tolerance to medication, and good symptom control.    5.  Depression: management changes made at last visit include increasing Citalopram to  daily.  Spent Christmas with daughter; spent Thanksgiving with other daughter.  Sees son every other week; son lives in [place]; son with throat cancer; s/p removal of voicebox.  Mother still doing well at age 56; mother lives in [place].  Goes to [place] twice yearly but will likely go more often.  Crocheting now; has not crocheted in eight years.  Going to grocery store; goes to church; does not like to get out at night.  Church is close to house.     Review of Systems  Constitutional: Negative for fever, chills, diaphoresis and fatigue.  Eyes: Negative for visual disturbance.  Respiratory: Negative for cough and shortness of breath.     Cardiovascular: Negative for chest pain, palpitations and leg swelling.  Gastrointestinal: Negative for nausea, vomiting, abdominal pain, diarrhea and constipation.  Endocrine: Negative for cold intolerance, heat intolerance, polydipsia, polyphagia and polyuria.  Neurological: Negative for dizziness, tremors, seizures, syncope, facial asymmetry, speech difficulty, weakness, light-headedness, numbness and headaches.    Past Medical History  Diagnosis Date  . Essential hypertension, benign   . Pure hypercholesterolemia   . Type II or unspecified type diabetes mellitus without mention of complication, not stated as uncontrolled   . Osteoporosis   . Leg cramps   . Unspecified disorder of skin and subcutaneous tissue   . Unspecified hypothyroidism   . Unspecified vitamin D deficiency   . Personal history of colonic polyps   . Hypercalcemia   . Chicken pox   . Measles   . Mumps    Past Surgical History  Procedure Laterality Date  . Neck surgery      L neck cyst resection  . Thyroid cyst excision    . Hernia repair  1981  . Cataract surgery  02/2011    Left; Right 2009  . Breast ductal resection  02/2010    Left-[facility]. Resection of thickened breast duct. Symptoms: L breast drainage hemocult +  . Abdominal hysterectomy  1980    R Ovary intact.  DUB.  Marland Kitchen Cholecystectomy     No Known Allergies Current Outpatient Prescriptions  Medication Sig Dispense Refill  . alendronate (FOSAMAX) 70 MG tablet Take 1 tablet (70 mg  total) by mouth every 7 (seven) days. Take with a full glass of water on an empty stomach. 12 tablet 3  . Ascorbic Acid (VITAMIN C) 1000 MG tablet Take 1,000 mg by mouth daily.    Marland Kitchen aspirin 81 MG tablet Take 81 mg by mouth daily.    . cholecalciferol (VITAMIN D) 1000 UNITS tablet Take 1,000 Units by mouth daily.    . citalopram (CELEXA) 20 MG tablet Take 1 tablet (20 mg total) by mouth daily. 90 tablet 3  . Lancets MISC Test blood sugar daily. Dx code: 250.00 100  each 3  . metoprolol succinate (TOPROL-XL) 50 MG 24 hr tablet 1 tablet daily 90 tablet 3  . Multiple Vitamin (MULTIVITAMINS PO) Take by mouth.    Marland Kitchen PRESCRIPTION MEDICATION T/S Bayer Contour Test Strips 100    . amLODipine (NORVASC) 10 MG tablet Take 1 tablet (10 mg total) by mouth daily. 90 tablet 1  . glucose blood test strip Test blood sugar daily. Dx code: E11.9 100 each 3  . levothyroxine (SYNTHROID, LEVOTHROID) 50 MCG tablet Take 1 tablet (50 mcg total) by mouth daily. 90 tablet 1  . losartan-hydrochlorothiazide (HYZAAR) 100-25 MG per tablet Take 1 tablet by mouth daily. 90 tablet 1  . metFORMIN (GLUCOPHAGE) 500 MG tablet Take 1 tablet (500 mg total) by mouth 3 (three) times daily. 270 tablet 1  . omeprazole (PRILOSEC) 20 MG capsule Take 1 capsule (20 mg total) by mouth daily. For indigestion. 90 capsule 1  . oxybutynin (DITROPAN) 5 MG tablet Take 1 tablet (5 mg total) by mouth 3 (three) times daily. 270 tablet 1  . simvastatin (ZOCOR) 20 MG tablet Take 1 tablet (20 mg total) by mouth every evening. 90 tablet 1   No current facility-administered medications for this visit.       Objective:    BP 110/50 mmHg  Pulse 57  Temp(Src) 97.8 F (36.6 C) (Oral)  Resp 16  Ht 5' 2.5" (1.588 m)  Wt 177 lb 12.8 oz (80.65 kg)  BMI 31.98 kg/m2  SpO2 97% Physical Exam  Constitutional: She is oriented to person, place, and time. She appears well-developed and well-nourished. No distress.  HENT:  Head: Normocephalic and atraumatic.  Right Ear: External ear normal.  Left Ear: External ear normal.  Nose: Nose normal.  Mouth/Throat: Oropharynx is clear and moist.  Eyes: Conjunctivae and EOM are normal. Pupils are equal, round, and reactive to light.  Neck: Normal range of motion. Neck supple. Carotid bruit is not present. No thyromegaly present.  Cardiovascular: Normal rate, regular rhythm, normal heart sounds and intact distal pulses.  Exam reveals no gallop and no friction rub.   No murmur  heard. Pulmonary/Chest: Effort normal and breath sounds normal. She has no wheezes. She has no rales.  Abdominal: Soft. Bowel sounds are normal. She exhibits no distension and no mass. There is no tenderness. There is no rebound and no guarding.  Lymphadenopathy:    She has no cervical adenopathy.  Neurological: She is alert and oriented to person, place, and time. No cranial nerve deficit.  Skin: Skin is warm and dry. No rash noted. She is not diaphoretic. No erythema. No pallor.  Psychiatric: She has a normal mood and affect. Her behavior is normal.        Assessment & Plan:   1. Type 2 diabetes mellitus without complication   2. Essential hypertension, benign   3. Hyperlipidemia   4. Hypothyroidism due to acquired atrophy of thyroid  5. Depression   6. Encounter for screening mammogram for breast cancer   7. Osteoporosis     1. DMII: controlled; obtain labs; refills provided. 2.  HTN: over-corrected and low for age; decrease Metoprolol ER 50mg  to one tablet daily.  Obtain labs. 3.  Hyperlipidemia; controlled; obtain labs; refill provided. 4.  Hypothyroidism: controlled; obtain labs; continue current medications. 5.  Depression: improved since last visit; tolerating Citalopram 20mg  daily; no changes to therapy; counseling provided during visit. 6.  Breast cancer screening: [facility] [place]. 7.  Osteoporosis: [facility].   No orders of the defined types were placed in this encounter.    Return in about 3 months (around 08/11/2014) for complete physical examiniation.    Kristi Paulita FujitaMartin Smith, M.D. [facility]  [facility] [address] [place], [place]  [id number] [phone] phone [phone] fax

## 2014-05-24 ENCOUNTER — Telehealth: Payer: Self-pay

## 2014-05-24 DIAGNOSIS — I10 Essential (primary) hypertension: Secondary | ICD-10-CM

## 2014-05-24 DIAGNOSIS — R05 Cough: Secondary | ICD-10-CM

## 2014-05-24 DIAGNOSIS — R059 Cough, unspecified: Secondary | ICD-10-CM

## 2014-05-24 MED ORDER — SIMVASTATIN 20 MG PO TABS: 20.0000 mg | ORAL_TABLET | Freq: Every evening | ORAL | Status: DC

## 2014-05-24 MED ORDER — OMEPRAZOLE 20 MG PO CPDR: 20.0000 mg | DELAYED_RELEASE_CAPSULE | Freq: Every day | ORAL | Status: DC

## 2014-05-24 MED ORDER — OXYBUTYNIN CHLORIDE 5 MG PO TABS: 5.0000 mg | ORAL_TABLET | Freq: Three times a day (TID) | ORAL | Status: DC

## 2014-05-24 MED ORDER — LEVOTHYROXINE SODIUM 50 MCG PO TABS: 50.0000 ug | ORAL_TABLET | Freq: Every day | ORAL | Status: DC

## 2014-05-24 MED ORDER — METFORMIN HCL 500 MG PO TABS: 500.0000 mg | ORAL_TABLET | Freq: Three times a day (TID) | ORAL | Status: DC

## 2014-05-24 MED ORDER — LOSARTAN POTASSIUM-HCTZ 100-25 MG PO TABS: 1.0000 | ORAL_TABLET | Freq: Every day | ORAL | Status: DC

## 2014-05-24 MED ORDER — AMLODIPINE BESYLATE 10 MG PO TABS: 10.0000 mg | ORAL_TABLET | Freq: Every day | ORAL | Status: DC

## 2014-05-24 MED ORDER — GLUCOSE BLOOD VI STRP: ORAL_STRIP | Status: DC

## 2014-05-24 NOTE — Telephone Encounter (Signed)
Patient is calling because she was supposed to have received refills for all of her medications her last office 05/11/14 visit but hasn't heard anything. Please call patient for details. Patient wouldn't give specific medications, she states that she needs all the medications except for the blood sugar medication. Patient phone: [phone] This message is marked high priority because patient is out of  some medications and nearly out of others.

## 2014-05-24 NOTE — Telephone Encounter (Signed)
Called pt and went over her current med list and she advised which needed refills and which need refills ALSO sent locally to hold her over until mail order arrives. Verified brand of test strips also.

## 2014-05-28 ENCOUNTER — Encounter: Payer: Self-pay | Admitting: Family Medicine

## 2014-05-28 MED ORDER — METOPROLOL SUCCINATE ER 50 MG PO TB24: ORAL_TABLET | ORAL | Status: DC

## 2014-06-07 DIAGNOSIS — R05 Cough: Secondary | ICD-10-CM | POA: Diagnosis not present

## 2014-06-07 DIAGNOSIS — N390 Urinary tract infection, site not specified: Secondary | ICD-10-CM | POA: Diagnosis not present

## 2014-06-07 DIAGNOSIS — B9620 Unspecified Escherichia coli [E. coli] as the cause of diseases classified elsewhere: Secondary | ICD-10-CM | POA: Diagnosis not present

## 2014-06-07 DIAGNOSIS — I10 Essential (primary) hypertension: Secondary | ICD-10-CM | POA: Diagnosis not present

## 2014-06-07 DIAGNOSIS — R410 Disorientation, unspecified: Secondary | ICD-10-CM | POA: Diagnosis not present

## 2014-06-07 DIAGNOSIS — R4182 Altered mental status, unspecified: Secondary | ICD-10-CM | POA: Diagnosis not present

## 2014-06-07 DIAGNOSIS — N3000 Acute cystitis without hematuria: Secondary | ICD-10-CM | POA: Diagnosis not present

## 2014-06-07 DIAGNOSIS — R0602 Shortness of breath: Secondary | ICD-10-CM | POA: Diagnosis not present

## 2014-06-07 DIAGNOSIS — F05 Delirium due to known physiological condition: Secondary | ICD-10-CM | POA: Diagnosis not present

## 2014-06-07 LAB — COMPREHENSIVE METABOLIC PANEL
ALBUMIN: 4.7 g/dL
Alkaline Phosphatase: 52 U/L
Anion Gap: 11 (ref 7–16)
BILIRUBIN TOTAL: 1.1 mg/dL
BUN: 19 mg/dL
CALCIUM: 9.6 mg/dL
Chloride: 96 mmol/L — ABNORMAL LOW
Co2: 28 mmol/L
Creatinine: 0.92 mg/dL
EGFR (Non-African Amer.): 59 — ABNORMAL LOW
Glucose: 159 mg/dL — ABNORMAL HIGH
POTASSIUM: 4.5 mmol/L
SGOT(AST): 26 U/L
SGPT (ALT): 18 U/L
SODIUM: 135 mmol/L
TOTAL PROTEIN: 7.7 g/dL

## 2014-06-07 LAB — URINALYSIS, COMPLETE
BLOOD: NEGATIVE
Bilirubin,UR: NEGATIVE
Glucose,UR: NEGATIVE mg/dL (ref 0–75)
Hyaline Cast: 2
Ketone: NEGATIVE
NITRITE: POSITIVE
PROTEIN: NEGATIVE
Ph: 6 (ref 4.5–8.0)
RBC,UR: 1 /HPF (ref 0–5)
SPECIFIC GRAVITY: 1.009 (ref 1.003–1.030)
WBC UR: 17 /HPF (ref 0–5)

## 2014-06-07 LAB — CBC WITH DIFFERENTIAL/PLATELET
Basophil #: 0 10*3/uL (ref 0.0–0.1)
Basophil %: 0.3 %
Eosinophil #: 0 10*3/uL (ref 0.0–0.7)
Eosinophil %: 0.2 %
HCT: 41.8 % (ref 35.0–47.0)
HGB: 13.7 g/dL (ref 12.0–16.0)
Lymphocyte #: 2.1 10*3/uL (ref 1.0–3.6)
Lymphocyte %: 18.4 %
MCH: 28.8 pg (ref 26.0–34.0)
MCHC: 32.9 g/dL (ref 32.0–36.0)
MCV: 88 fL (ref 80–100)
MONO ABS: 0.7 x10 3/mm (ref 0.2–0.9)
MONOS PCT: 6.2 %
NEUTROS PCT: 74.9 %
Neutrophil #: 8.4 10*3/uL — ABNORMAL HIGH (ref 1.4–6.5)
PLATELETS: 260 10*3/uL (ref 150–440)
RBC: 4.77 10*6/uL (ref 3.80–5.20)
RDW: 13.3 % (ref 11.5–14.5)
WBC: 11.2 10*3/uL — AB (ref 3.6–11.0)

## 2014-06-07 LAB — TROPONIN I: Troponin-I: <0.03 ng/mL

## 2014-06-07 LAB — TSH: Thyroid Stimulating Horm: 1.667 u[IU]/mL

## 2014-06-08 ENCOUNTER — Observation Stay
Admit: 2014-06-08 | Disposition: A | Discharge status: Home / Self Care | Payer: Self-pay | Attending: Internal Medicine | Admitting: Internal Medicine

## 2014-06-08 DIAGNOSIS — E119 Type 2 diabetes mellitus without complications: Secondary | ICD-10-CM | POA: Diagnosis not present

## 2014-06-08 DIAGNOSIS — N390 Urinary tract infection, site not specified: Secondary | ICD-10-CM | POA: Diagnosis not present

## 2014-06-08 DIAGNOSIS — I10 Essential (primary) hypertension: Secondary | ICD-10-CM | POA: Diagnosis not present

## 2014-06-08 DIAGNOSIS — F05 Delirium due to known physiological condition: Secondary | ICD-10-CM | POA: Diagnosis not present

## 2014-06-10 LAB — URINE CULTURE

## 2014-06-13 LAB — CULTURE, BLOOD (SINGLE)

## 2014-06-22 ENCOUNTER — Encounter: Payer: Self-pay | Admitting: Family Medicine

## 2014-06-22 ENCOUNTER — Ambulatory Visit (INDEPENDENT_AMBULATORY_CARE_PROVIDER_SITE_OTHER): Payer: Medicare Other | Admitting: Family Medicine

## 2014-06-22 VITALS — BP 132/72 | HR 61 | Temp 98.3°F | Resp 16 | Ht 62.75 in | Wt 184.0 lb

## 2014-06-22 DIAGNOSIS — R410 Disorientation, unspecified: Secondary | ICD-10-CM

## 2014-06-22 DIAGNOSIS — R441 Visual hallucinations: Secondary | ICD-10-CM | POA: Diagnosis not present

## 2014-06-22 DIAGNOSIS — E890 Postprocedural hypothyroidism: Secondary | ICD-10-CM

## 2014-06-22 DIAGNOSIS — R5381 Other malaise: Secondary | ICD-10-CM | POA: Diagnosis not present

## 2014-06-22 DIAGNOSIS — I10 Essential (primary) hypertension: Secondary | ICD-10-CM | POA: Diagnosis not present

## 2014-06-22 DIAGNOSIS — N390 Urinary tract infection, site not specified: Secondary | ICD-10-CM | POA: Diagnosis not present

## 2014-06-22 DIAGNOSIS — E785 Hyperlipidemia, unspecified: Secondary | ICD-10-CM

## 2014-06-22 DIAGNOSIS — R269 Unspecified abnormalities of gait and mobility: Secondary | ICD-10-CM | POA: Diagnosis not present

## 2014-06-22 DIAGNOSIS — E119 Type 2 diabetes mellitus without complications: Secondary | ICD-10-CM | POA: Diagnosis not present

## 2014-06-22 DIAGNOSIS — Z1231 Encounter for screening mammogram for malignant neoplasm of breast: Secondary | ICD-10-CM

## 2014-06-22 LAB — POCT URINALYSIS DIPSTICK
Bilirubin, UA: NEGATIVE
Blood, UA: NEGATIVE
Glucose, UA: NEGATIVE
Ketones, UA: NEGATIVE
Nitrite, UA: NEGATIVE
PH UA: 6.5
PROTEIN UA: NEGATIVE
Urobilinogen, UA: 0.2

## 2014-06-22 LAB — CBC WITH DIFFERENTIAL/PLATELET
BASOS PCT: 0 % (ref 0–1)
Basophils Absolute: 0 10*3/uL (ref 0.0–0.1)
Eosinophils Absolute: 0.1 10*3/uL (ref 0.0–0.7)
Eosinophils Relative: 1 % (ref 0–5)
HCT: 40.2 % (ref 36.0–46.0)
Hemoglobin: 13.9 g/dL (ref 12.0–15.0)
LYMPHS ABS: 2.5 10*3/uL (ref 0.7–4.0)
LYMPHS PCT: 26 % (ref 12–46)
MCH: 29.2 pg (ref 26.0–34.0)
MCHC: 34.6 g/dL (ref 30.0–36.0)
MCV: 84.5 fL (ref 78.0–100.0)
MONOS PCT: 8 % (ref 3–12)
MPV: 10.3 fL (ref 8.6–12.4)
Monocytes Absolute: 0.8 10*3/uL (ref 0.1–1.0)
NEUTROS ABS: 6.3 10*3/uL (ref 1.7–7.7)
Neutrophils Relative %: 65 % (ref 43–77)
Platelets: 268 10*3/uL (ref 150–400)
RBC: 4.76 MIL/uL (ref 3.87–5.11)
RDW: 13.3 % (ref 11.5–15.5)
WBC: 9.7 10*3/uL (ref 4.0–10.5)

## 2014-06-22 LAB — POCT UA - MICROSCOPIC ONLY
Casts, Ur, LPF, POC: NEGATIVE
Crystals, Ur, HPF, POC: NEGATIVE
MUCUS UA: NEGATIVE
RBC, urine, microscopic: NEGATIVE
Yeast, UA: NEGATIVE

## 2014-06-22 LAB — VITAMIN B12: Vitamin B-12: 275 pg/mL (ref 211–911)

## 2014-06-22 LAB — COMPREHENSIVE METABOLIC PANEL
ALT: 15 U/L (ref 0–35)
AST: 13 U/L (ref 0–37)
Albumin: 4.3 g/dL (ref 3.5–5.2)
Alkaline Phosphatase: 42 U/L (ref 39–117)
BUN: 11 mg/dL (ref 6–23)
CALCIUM: 10.3 mg/dL (ref 8.4–10.5)
CO2: 26 meq/L (ref 19–32)
CREATININE: 0.68 mg/dL (ref 0.50–1.10)
Chloride: 98 mEq/L (ref 96–112)
GLUCOSE: 110 mg/dL — AB (ref 70–99)
Potassium: 3.9 mEq/L (ref 3.5–5.3)
Sodium: 136 mEq/L (ref 135–145)
Total Bilirubin: 0.9 mg/dL (ref 0.2–1.2)
Total Protein: 6.8 g/dL (ref 6.0–8.3)

## 2014-06-22 LAB — TSH: TSH: 0.76 u[IU]/mL (ref 0.350–4.500)

## 2014-06-22 MED ORDER — ALENDRONATE SODIUM 70 MG PO TABS: 70.0000 mg | ORAL_TABLET | ORAL | Status: DC

## 2014-06-22 NOTE — Patient Instructions (Signed)
1. Walk to mailbox twice daily if good weather.

## 2014-06-23 ENCOUNTER — Ambulatory Visit
Admit: 2014-06-23 | Disposition: A | Discharge status: Home / Self Care | Payer: Self-pay | Attending: Family Medicine | Admitting: Family Medicine

## 2014-06-23 DIAGNOSIS — N390 Urinary tract infection, site not specified: Secondary | ICD-10-CM | POA: Insufficient documentation

## 2014-06-23 DIAGNOSIS — R441 Visual hallucinations: Secondary | ICD-10-CM | POA: Insufficient documentation

## 2014-06-23 DIAGNOSIS — R410 Disorientation, unspecified: Secondary | ICD-10-CM | POA: Insufficient documentation

## 2014-06-23 DIAGNOSIS — Z1231 Encounter for screening mammogram for malignant neoplasm of breast: Secondary | ICD-10-CM | POA: Diagnosis not present

## 2014-06-23 LAB — HM MAMMOGRAPHY

## 2014-06-23 NOTE — Progress Notes (Signed)
Subjective:    Patient ID: Kara Rogers, female    DOB: February 23, 1932, 81 y.o.   MRN: [id number]  06/22/2014  follow up UTI   HPI This 81 y.o. female presents for TRANSITION INTO CARE.    Hospital admission at [facility]:  06-08-14 Discharge from [facility]: 06-08-14  Admission diagnosis:  Hallucinations from UTI.  Discharge diagnoses:   1.  Hallucinations from UTI, resolved. 2.  Acute cystitis without hematuria: treated with Rocephin and then Keflex for ten days. 3.  DMII controlled. 4.  Hypothyroidism  Consultations: none   Labs:  Blood culture negative to date; CT head negative with mild chronic atrophy and small vessel ischemic changes; u/a:  Negative blood, negative protein, +nitrites; trace LE. Marland Kitchen MIcro:  1RBC, 17 WBC, trace bacteria, 1 epithelial, 2 hyaline cast.  WBC 11,200.  CMET WNL.  Troponin <0.03.  TSH 1.667.  CXR:  NAD.    Patient reports that for two days she suffered visual hallucinations; she saw two of her children at her home; they would not talk with patient at all.  Finally patient's daughter in law called 911 and patient was transported to [facility] ED.  Denies fever/chills/sweats. Denies headaches, blurred vision, diplopia, dizziness, focal weakness, paresthesias.  Denies chest pain, palpitations, shortness of breath, leg swelling. Denies n/v/d.  Denies dysuria, frequency, hematuria, worsening urgency, worsening nocturia with onset of hallucinations. Denies taking any other medications than prescribed.  Emotionally stable; husband has died in the past year and patient suffering with some mild depression; feels that mood is stable.  Denies SI/HI.  Completed Keflex therapy three days ago.  Extremely fatigued since hospital discharge.  Also feels unsteady on feet; struggling to get out of chair independently. Has not gone anywhere since discharge. Is afraid to get out due to unsteadiness.  Rarely leaving the house. Walks to the mailbox every other day on average. No further hallucinations  since discharge.  Good family support locally but lives alone.  Has not driven much since discharge.   Review of Systems  Constitutional: Negative for fever, chills, diaphoresis and fatigue.  HENT: Negative for congestion, rhinorrhea, sinus pressure and sore throat.   Eyes: Negative for visual disturbance.  Respiratory: Negative for cough and shortness of breath.   Cardiovascular: Negative for chest pain, palpitations and leg swelling.  Gastrointestinal: Negative for nausea, vomiting, abdominal pain, diarrhea and constipation.  Endocrine: Negative for cold intolerance, heat intolerance, polydipsia, polyphagia and polyuria.  Musculoskeletal: Negative for myalgias, back pain, joint swelling, arthralgias, gait problem, neck pain and neck stiffness.  Skin: Negative for color change and rash.  Neurological: Negative for dizziness, tremors, seizures, syncope, facial asymmetry, speech difficulty, weakness, light-headedness, numbness and headaches.  Psychiatric/Behavioral: Positive for dysphoric mood. Negative for suicidal ideas, hallucinations, sleep disturbance and self-injury. The patient is not nervous/anxious.     Past Medical History  Diagnosis Date  . Essential hypertension, benign   . Pure hypercholesterolemia   . Type II or unspecified type diabetes mellitus without mention of complication, not stated as uncontrolled   . Osteoporosis   . Leg cramps   . Unspecified disorder of skin and subcutaneous tissue   . Unspecified hypothyroidism   . Unspecified vitamin D deficiency   . Personal history of colonic polyps   . Hypercalcemia   . Chicken pox   . Measles   . Mumps    Past Surgical History  Procedure Laterality Date  . Neck surgery      L neck cyst resection  .  Thyroid cyst excision    . Hernia repair  1981  . Cataract surgery  02/2011    Left; Right 07-09-07  . Breast ductal resection  02/2010    Left-[place]. Resection of thickened breast duct. Symptoms: L breast drainage  hemocult +  . Abdominal hysterectomy  1980    R Ovary intact.  DUB.  Marland Kitchen Cholecystectomy     No Known Allergies Current Outpatient Prescriptions  Medication Sig Dispense Refill  . alendronate (FOSAMAX) 70 MG tablet Take 1 tablet (70 mg total) by mouth every 7 (seven) days. Take with a full glass of water on an empty stomach. 12 tablet 3  . amLODipine (NORVASC) 10 MG tablet Take 1 tablet (10 mg total) by mouth daily. 90 tablet 1  . Ascorbic Acid (VITAMIN C) 1000 MG tablet Take 1,000 mg by mouth daily.    Marland Kitchen aspirin 81 MG tablet Take 81 mg by mouth daily.    . cholecalciferol (VITAMIN D) 1000 UNITS tablet Take 1,000 Units by mouth daily.    . citalopram (CELEXA) 20 MG tablet Take 1 tablet (20 mg total) by mouth daily. 90 tablet 3  . glucose blood test strip Test blood sugar daily. Dx code: E11.9 100 each 3  . Lancets MISC Test blood sugar daily. Dx code: 250.00 100 each 3  . levothyroxine (SYNTHROID, LEVOTHROID) 50 MCG tablet Take 1 tablet (50 mcg total) by mouth daily. 90 tablet 1  . losartan-hydrochlorothiazide (HYZAAR) 100-25 MG per tablet Take 1 tablet by mouth daily. 90 tablet 1  . metFORMIN (GLUCOPHAGE) 500 MG tablet Take 1 tablet (500 mg total) by mouth 3 (three) times daily. 270 tablet 1  . metoprolol succinate (TOPROL-XL) 50 MG 24 hr tablet 1 tablet daily 90 tablet 3  . Multiple Vitamin (MULTIVITAMINS PO) Take by mouth.    Marland Kitchen omeprazole (PRILOSEC) 20 MG capsule Take 1 capsule (20 mg total) by mouth daily. For indigestion. 90 capsule 1  . oxybutynin (DITROPAN) 5 MG tablet Take 1 tablet (5 mg total) by mouth 3 (three) times daily. 270 tablet 1  . PRESCRIPTION MEDICATION T/S Bayer Contour Test Strips 100    . simvastatin (ZOCOR) 20 MG tablet Take 1 tablet (20 mg total) by mouth every evening. 90 tablet 1   No current facility-administered medications for this visit.   History   Social History  . Marital Status: Married    Spouse Name: Pamelia Hoit  . Number of Children: 3  . Years of  Education: 12   Occupational History  . retired     Jul 09, 2006   Social History Main Topics  . Smoking status: Never Smoker   . Smokeless tobacco: Not on file  . Alcohol Use: No  . Drug Use: No  . Sexual Activity: Not on file   Other Topics Concern  . Not on file   Social History Narrative   Marital status:  Married since 90,second marriage, first husband died 2002-07-09 (71 years of marriage). Happily married;no abuse.      Lives: with husband.      Children:  3 children (39,51,41) son in [place], 2 daughters in [place];  5 grandchildren, 5 gg.        Tobacco: none       Alcohol: none        Exercise: Walking 45-60 minutes. 3 x week; exercise program with church.       Living Will:  Patient DOES have Living Will; +CPR but DNI.      ADLs:  Independent with all ADL's; drives.  No assistant devices.  No falls in 2014.        Caffeine:  Consumes a minimal amount of caffeine.      Home safety:  Smoke alarms and carbon monoxide detector in the home.       Seatbelts:  Always uses seat belts.      Organ donor:  No.       Guns:  Guns in the home: No unsecured guns in the home.        Objective:    BP 132/72 mmHg  Pulse 61  Temp(Src) 98.3 F (36.8 C) (Oral)  Resp 16  Ht 5' 2.75" (1.594 m)  Wt 184 lb (83.462 kg)  BMI 32.85 kg/m2  SpO2 96% Physical Exam  Constitutional: She is oriented to person, place, and time. She appears well-developed and well-nourished. No distress.  HENT:  Head: Normocephalic and atraumatic.  Right Ear: External ear normal.  Left Ear: External ear normal.  Nose: Nose normal.  Mouth/Throat: Oropharynx is clear and moist.  Eyes: Conjunctivae and EOM are normal. Pupils are equal, round, and reactive to light.  Neck: Normal range of motion. Neck supple. Carotid bruit is not present. No thyromegaly present.  Cardiovascular: Normal rate, regular rhythm, normal heart sounds and intact distal pulses.  Exam reveals no gallop and no friction rub.   No murmur  heard. Pulmonary/Chest: Effort normal and breath sounds normal. She has no wheezes. She has no rales.  Abdominal: Soft. Bowel sounds are normal. She exhibits no distension and no mass. There is no tenderness. There is no rebound and no guarding.  Lymphadenopathy:    She has no cervical adenopathy.  Neurological: She is alert and oriented to person, place, and time. No cranial nerve deficit. She exhibits normal muscle tone. Coordination normal.  Slow to transition from sitting to standing.  Skin: Skin is warm and dry. No rash noted. She is not diaphoretic. No erythema. No pallor.  Psychiatric: She has a normal mood and affect. Her behavior is normal. Judgment and thought content normal.        Assessment & Plan:   1. Acute delirium   2. Hallucinations, visual   3. UTI (lower urinary tract infection)   4. Visit for screening mammogram   5. Type 2 diabetes mellitus without complication   6. Postoperative hypothyroidism   7. Essential hypertension, benign   8. Hyperlipidemia   9. Physical deconditioning    1. Acute delirium: New and now resolved.  Warranting hospitalization.  Now resolved.  S/p CT head and TSH and glucose during admission.  Urine culture and blood culture pending upon discharge.  Will obtain results.  Normal neurological and mental status exam in office today. 2.  Visual hallucinations: New and now resolved.  Felt secondary to UTI.   Will obtain B12 level; consider RPR, HIV. 3.  UTI/acute cystitis: New and now resolved; obtain urine; obtain results of urine culture and blood cultures from [facility]. 4.  Physical deconditioning: New.  Since hospitalization; recommend ambulating to mailbox twice daily; recommend family member walking with patient; pt also has [employer]. 5.  DMII: controlled.  No changes to management. 6. HTN: controlled/stable; no changes to management. 7. Hyperlipidemia: controlled; no changes to management. 8. Hypothyroidism: controlled; no changes to  management; obtain labs. 9.  Visit for mammogram;: scheduled for mammogram tomorrow.   Meds ordered this encounter  Medications  . alendronate (FOSAMAX) 70 MG tablet    Sig: Take 1 tablet (70  mg total) by mouth every 7 (seven) days. Take with a full glass of water on an empty stomach.    Dispense:  12 tablet    Refill:  3    Return in about 4 weeks (around 07/20/2014) for recheck.    Kendyl Bissonnette Paulita Fujita, M.D. [facility] [address] [place], [place]  [address] [phone] phone [phone] fax

## 2014-06-24 LAB — URINE CULTURE: Colony Count: 80000

## 2014-06-30 ENCOUNTER — Encounter: Payer: Self-pay | Admitting: *Deleted

## 2014-06-30 DIAGNOSIS — N390 Urinary tract infection, site not specified: Secondary | ICD-10-CM | POA: Diagnosis not present

## 2014-07-03 NOTE — Discharge Summary (Signed)
PATIENT NAME:  Kara RibasHESTER, Tineshia M MR#:  [id number] DATE OF BIRTH:  1931-10-16  DATE OF ADMISSION:  06/08/2014 DATE OF DISCHARGE:  06/08/2014  ADMISSION DIAGNOSIS: Hallucinations from a urinary tract infection.   DISCHARGE DIAGNOSES:   1.  Hallucinations from a urinary tract infection, resolving. 2.  Acute cystitis without hematuria.  3.  Type 2 diabetes without complications.  4.  Acquired hypothyroidism.   CONSULTATIONS: None.   PERTINENT LABORATORIES AT DISCHARGE:  Blood cultures are negative to date.   CT of the head was negative.  HOSPITAL COURSE:  An 81 year old female who presented with hallucinations and found to have a urinary tract infection. For further details, please refer to the H and P.  1.  Hallucinations with delirium due to a urinary tract infection. The patient seems to be completely at baseline.  She has no evidence of hallucinations or delirium, resolved with antibiotics.  2.  Urinary tract infection with acute cystitis, no hematuria. The patient was given Rocephin in the ER and she was continued on this and then changed to p.o. Keflex.  3.  Type 2 diabetes. The patient will continue outpatient medications.  4.  Hypertension. She will continue on metoprolol. Heart rate was noted to be in the 60s. She is asymptomatic. This will be followed up as an outpatient.  5.  Hypothyroidism. She is on Synthroid at home.   PHYSICAL EXAMINATION AT DISCHARGE:  VITAL SIGNS: Temperature 97, pulse 60, respirations 18, blood pressure 129/61, 95 on room air.  GENERAL: The patient is alert and oriented, not in acute distress.   NEUROLOGIC:  Cranial nerves II through XII intact. No focal deficits.  CARDIOVASCULAR: Regular rate and rhythm. No murmurs, gallops, or rubs.   LUNGS: Clear to auscultation without crackles, rales, rhonchi, or wheezing.   DISCHARGE MEDICATIONS:  1. Keflex 500 mg p.o. t.i.d. x 10 days.  2.  Simvastatin 20 mg at bedtime.  3.  Synthroid 50 mcg daily.  4.  Norvasc  10 mg daily.  5.  Metoprolol 50 mg daily.  5.  HCTZ/losartan 25/100 daily.  6.  Oxybutynin 5 mg t.i.d.  7.  Citalopram 20 mg daily.  8.  Omeprazole 20 mg daily.  9.  Metformin 500 t.i.d.  10.  Aspirin 81 mg daily.   DISCHARGE DIET: Regular, low sodium.   ACTIVITY:  As tolerated.  DISCHARGE FOLLOWUP: The patient will follow up with Nilda SimmerKristi Smith in 1 week.  TIME SPENT: Approximately 40 minutes.  The patient was stable for discharge.   ____________________________ Ariyah Sedlack P. Juliene PinaMody, MD spm:sp D: 06/08/2014 11:03:16 ET T: 06/08/2014 12:02:38 ET JOB#: [id number]  cc: Marry Kusch P. Juliene PinaMody, MD, <Dictator> Myrle ShengKristi M. Katrinka BlazingSmith, MD Janyth ContesSITAL P Anshi Jalloh MD ELECTRONICALLY SIGNED 06/08/2014 12:37

## 2014-07-03 NOTE — H&P (Signed)
PATIENT NAME:  Kara Rogers, Kara Rogers MR#:  [id number] DATE OF BIRTH:  09-07-1931  DATE OF ADMISSION:  06/08/2014  CHIEF COMPLAINT:  Altered mental status.   HISTORY OF PRESENT ILLNESS:  This is an 81 year old female who was brought to the ED by EMS after family called reporting that the patient was having hallucinations. The patient denies any sort of fevers, chills, dysuria, abdominal tenderness, nausea, vomiting. Endorses only some mild diarrhea. Denies everything else. See full review of systems below. In the ED, on basic workup, the patient was found to have nitrite-positive UA, so she has a UTI. Other labs are very largely within normal limits except a very, very, very, very, very mild elevation in white blood count to 11.2. This also includes a negative CT of the head done in the ED. The patient's family was somewhat concerned about her hallucinations and so the hospitalists were called for admission for UTI and delirium.   PRIMARY CARE PHYSICIAN:  Myrle Sheng. Katrinka Blazing, MD   PAST MEDICAL HISTORY:  Type 2 diabetes, hypothyroidism, hyperlipidemia, hypertension, arthritis.   CURRENT MEDICATIONS:  Synthroid 50 mcg daily, simvastatin 20 mg daily, Fosamax 70 mg weekly, amlodipine 10 mg daily, metoprolol 50 mg extended release daily, losartan and HCTZ combo pill at 100/25 mg 1 tablet daily, oxybutynin 5 mg t.i.d., citalopram 20 mg daily, omeprazole 20 mg daily, metformin 500 mg b.i.d.   PAST SURGICAL HISTORY:  Gallbladder and thyroid surgery for a cyst removal and some history of a removed clotted breast gland duct. The patient was not able to elaborate more on this.   ALLERGIES:  No known drug allergies.   FAMILY HISTORY:  CAD, diabetes mellitus, osteoporosis.   SOCIAL HISTORY:  Nonsmoker, nondrinker. Denies illicit drug use.   REVIEW OF SYSTEMS: CONSTITUTIONAL:  Denies fever, fatigue, or weakness.  EYES:  Denies blurred or double vision, pain, or redness.  EARS, NOSE, AND THROAT:  Denies ear pain,  hearing loss, or difficulty swallowing.  RESPIRATORY:  Denies cough, hemoptysis, or painful respiration.  CARDIOVASCULAR:  Denies chest pain, edema, or palpitations.  GASTROINTESTINAL:  Endorses a little bit of diarrhea. Denies nausea, vomiting, abdominal pain, or constipation.  GENITOURINARY:  Denies dysuria, hematuria, or frequency.  ENDOCRINE:  Denies nocturia, thyroid problems, or heat or cold intolerance.  HEMATOLOGIC AND LYMPHATIC:  Denies easy bruising or bleeding or swollen glands.  INTEGUMENTARY:  Denies acne, rash, or lesion.  MUSCULOSKELETAL:  Denies acute arthritis, joint swelling, or gout.  NEUROLOGICAL:  Denies numbness, weakness, or headache.  PSYCHIATRIC:  Denies anxiety, insomnia, or depression.   PHYSICAL EXAMINATION: VITAL SIGNS:  Blood pressure 143/61, pulse 63, temperature 98.2, respirations 18 with 98% oxygen saturation on room air.  GENERAL:  This is a well-nourished, elderly lady lying supine in bed in good spirits.  HEENT:  Pupils are equal, round, and reactive to light and accommodation. Extraocular movements are intact. No scleral icterus. Moist mucosal membranes.  NECK:  Thyroid is not enlarged. Neck is supple. No masses. Nontender. No cervical adenopathy. No JVD.  RESPIRATORY:  Clear to auscultation bilaterally with no rales, rhonchi, or wheezes. No respiratory distress.  CARDIOVASCULAR:  Regular rate and rhythm. No murmurs, rubs, or gallops on exam. Good pedal pulses with trace lower extremity edema.  ABDOMEN:  Soft, largely nontender with very, very, very mild suprapubic tenderness to palpation. No masses palpated. Good bowel sounds.  MUSCULOSKELETAL:  Muscular strength is 5/5 in all 4 extremities with full spontaneous range of motion throughout. No cyanosis or clubbing.  SKIN:  No rash. No lesions. Skin is warm, dry, and intact.  LYMPHATIC:  No adenopathy.  NEUROLOGIC:  Cranial nerves are intact. Sensation is intact throughout. No dysarthria or aphasia.   PSYCHIATRIC:  Alert and oriented x 3 and cooperative, although somewhat confused depending on the questions.   LABORATORY DATA:  White count is 11.2, hemoglobin 13.7, hematocrit 41.8, platelets 260,000. Sodium is 135, potassium 4.5, chloride 96, CO2 of 28, BUN 19, creatinine 0.92, glucose 159, calcium 9.6, total protein 7.7, albumin 4.7, total bilirubin 1.1, alkaline phosphatase 52, AST 26, and ALT 18. Troponin is less than 0.03. TSH was 1.667, which is normal. Urinalysis showed positive nitrites, trace leukocyte esterase, and bacteria as well.   RADIOLOGIC DATA:  Again, chest x-ray is negative. CT of the head is negative.   ASSESSMENT AND PLAN: 1.  Urinary tract infection. The patient was given Rocephin in the ED. She will be continued on Rocephin here. Culture was sent and is pending. We will continue to monitor the patient for this problem and hope that she improves on this treatment.  2.  Delirium. The patient was having hallucinations. She is very pleasant. She is not agitated. She is not outrightly confused. However, she was having some hallucinations and speaking to people who were not there. This is perhaps secondary to her urinary tract infection. We will continue to monitor this for improvement.  3.  Diabetes mellitus. We will continue her home dose of metformin here and add sliding scale insulin to it.  4.  Hypertension. This problem is a chronic problem for this patient. Her blood pressure is mildly elevated at this time. We will continue to monitor, and we will utilize her home blood pressure medications.  5.  Hypothyroidism. Again, unclear about this dose specifically. The other medications we reviewed with her individually. Synthroid dose, she was not sure about. Her TSH is okay here. We will continue her home dose of Synthroid as reported and take it from there.  6.  Deep vein thrombosis prophylaxis with subcutaneous Lovenox.   CODE STATUS:  This patient is a full code.   TIME SPENT  ON THIS ADMISSION:  50 minutes.   ____________________________ Candace Cruiseavid F. Anne HahnWillis, MD dfw:nb D: 06/08/2014 01:46:31 ET T: 06/08/2014 04:24:08 ET JOB#: [id number]  cc: Candace Cruiseavid F. Anne HahnWillis, MD, <Dictator> Florette Thai Scotty CourtF Kimyetta Flott MD ELECTRONICALLY SIGNED 06/08/2014 13:10

## 2014-07-04 ENCOUNTER — Ambulatory Visit: Payer: Self-pay

## 2014-07-18 ENCOUNTER — Encounter: Payer: Self-pay | Admitting: *Deleted

## 2014-07-20 ENCOUNTER — Ambulatory Visit: Payer: Medicare Other | Admitting: Family Medicine

## 2014-08-02 ENCOUNTER — Telehealth: Payer: Self-pay

## 2014-08-02 NOTE — Telephone Encounter (Signed)
Patient had to cancel an appointment with Dr. Katrinka BlazingSmith for a CPE and called back today to reschedule. She was told that there are no physicals available until August. Patient asked if Dr. Katrinka BlazingSmith could give her a call. Please call! [phone]

## 2014-08-02 NOTE — Telephone Encounter (Signed)
lmom to cb. 

## 2014-08-04 MED ORDER — CEPHALEXIN 500 MG PO CAPS: 500.0000 mg | ORAL_CAPSULE | Freq: Three times a day (TID) | ORAL | Status: DC

## 2014-08-04 NOTE — Telephone Encounter (Signed)
Left message for pt to call back  °

## 2014-08-04 NOTE — Telephone Encounter (Signed)
Spoke with pt, she had a UTI and as given a course of medication. She would like an antibiotic sent in because her symptoms came back after the course. She states she still has some burning and trouble emptying her bladder. She was transferred to make another appt since she cancelled previous appt.

## 2014-08-04 NOTE — Telephone Encounter (Signed)
Spoke with pt, advised Rx sent in. For recurrent UTI symptoms.

## 2014-08-04 NOTE — Telephone Encounter (Signed)
Please clarify message --- Is patient currently requesting abx for recurrent UTI symptoms?  [facility]

## 2014-09-19 ENCOUNTER — Encounter: Payer: Self-pay | Admitting: Family Medicine

## 2014-09-19 ENCOUNTER — Ambulatory Visit (INDEPENDENT_AMBULATORY_CARE_PROVIDER_SITE_OTHER): Payer: Medicare Other | Admitting: Family Medicine

## 2014-09-19 VITALS — BP 164/75 | HR 59 | Temp 98.0°F | Resp 16 | Ht 62.5 in | Wt 183.6 lb

## 2014-09-19 DIAGNOSIS — E7800 Pure hypercholesterolemia, unspecified: Secondary | ICD-10-CM

## 2014-09-19 DIAGNOSIS — E034 Atrophy of thyroid (acquired): Secondary | ICD-10-CM | POA: Diagnosis not present

## 2014-09-19 DIAGNOSIS — E038 Other specified hypothyroidism: Secondary | ICD-10-CM

## 2014-09-19 DIAGNOSIS — F05 Delirium due to known physiological condition: Secondary | ICD-10-CM | POA: Diagnosis not present

## 2014-09-19 DIAGNOSIS — I10 Essential (primary) hypertension: Secondary | ICD-10-CM | POA: Diagnosis not present

## 2014-09-19 DIAGNOSIS — Z0000 Encounter for general adult medical examination without abnormal findings: Secondary | ICD-10-CM | POA: Diagnosis not present

## 2014-09-19 DIAGNOSIS — M8580 Other specified disorders of bone density and structure, unspecified site: Secondary | ICD-10-CM | POA: Diagnosis not present

## 2014-09-19 DIAGNOSIS — R0789 Other chest pain: Secondary | ICD-10-CM | POA: Diagnosis not present

## 2014-09-19 DIAGNOSIS — N3941 Urge incontinence: Secondary | ICD-10-CM

## 2014-09-19 DIAGNOSIS — E119 Type 2 diabetes mellitus without complications: Secondary | ICD-10-CM | POA: Diagnosis not present

## 2014-09-19 DIAGNOSIS — R5381 Other malaise: Secondary | ICD-10-CM | POA: Diagnosis not present

## 2014-09-19 DIAGNOSIS — R410 Disorientation, unspecified: Secondary | ICD-10-CM

## 2014-09-19 DIAGNOSIS — N390 Urinary tract infection, site not specified: Secondary | ICD-10-CM | POA: Diagnosis not present

## 2014-09-19 LAB — CBC WITH DIFFERENTIAL/PLATELET
BASOS ABS: 0 10*3/uL (ref 0.0–0.1)
BASOS PCT: 0 % (ref 0–1)
Eosinophils Absolute: 0.1 10*3/uL (ref 0.0–0.7)
Eosinophils Relative: 2 % (ref 0–5)
HCT: 39.9 % (ref 36.0–46.0)
HEMOGLOBIN: 13.5 g/dL (ref 12.0–15.0)
LYMPHS ABS: 1.9 10*3/uL (ref 0.7–4.0)
Lymphocytes Relative: 26 % (ref 12–46)
MCH: 28.5 pg (ref 26.0–34.0)
MCHC: 33.8 g/dL (ref 30.0–36.0)
MCV: 84.2 fL (ref 78.0–100.0)
MPV: 10.3 fL (ref 8.6–12.4)
Monocytes Absolute: 0.7 10*3/uL (ref 0.1–1.0)
Monocytes Relative: 10 % (ref 3–12)
Neutro Abs: 4.5 10*3/uL (ref 1.7–7.7)
Neutrophils Relative %: 62 % (ref 43–77)
Platelets: 283 10*3/uL (ref 150–400)
RBC: 4.74 MIL/uL (ref 3.87–5.11)
RDW: 13.4 % (ref 11.5–15.5)
WBC: 7.2 10*3/uL (ref 4.0–10.5)

## 2014-09-19 LAB — POCT URINALYSIS DIPSTICK
Bilirubin, UA: NEGATIVE
Blood, UA: NEGATIVE
Glucose, UA: NEGATIVE
Ketones, UA: NEGATIVE
NITRITE UA: POSITIVE
PH UA: 7
PROTEIN UA: NEGATIVE
SPEC GRAV UA: 1.01
Urobilinogen, UA: 0.2

## 2014-09-19 LAB — LIPID PANEL
CHOLESTEROL: 156 mg/dL (ref 0–200)
HDL: 58 mg/dL (ref >=46)
LDL Cholesterol: 74 mg/dL (ref 0–99)
TRIGLYCERIDES: 120 mg/dL (ref <150)
Total CHOL/HDL Ratio: 2.7 Ratio
VLDL: 24 mg/dL (ref 0–40)

## 2014-09-19 LAB — TSH: TSH: 1.003 u[IU]/mL (ref 0.350–4.500)

## 2014-09-19 LAB — COMPREHENSIVE METABOLIC PANEL
ALBUMIN: 4.4 g/dL (ref 3.5–5.2)
ALT: 15 U/L (ref 0–35)
AST: 15 U/L (ref 0–37)
Alkaline Phosphatase: 44 U/L (ref 39–117)
BUN: 14 mg/dL (ref 6–23)
CO2: 27 meq/L (ref 19–32)
Calcium: 10.2 mg/dL (ref 8.4–10.5)
Chloride: 96 mEq/L (ref 96–112)
Creat: 0.65 mg/dL (ref 0.50–1.10)
Glucose, Bld: 119 mg/dL — ABNORMAL HIGH (ref 70–99)
Potassium: 4.2 mEq/L (ref 3.5–5.3)
Sodium: 132 mEq/L — ABNORMAL LOW (ref 135–145)
TOTAL PROTEIN: 7 g/dL (ref 6.0–8.3)
Total Bilirubin: 0.7 mg/dL (ref 0.2–1.2)

## 2014-09-19 LAB — POCT UA - MICROSCOPIC ONLY
CASTS, UR, LPF, POC: NEGATIVE
CRYSTALS, UR, HPF, POC: NEGATIVE
Mucus, UA: NEGATIVE
Yeast, UA: NEGATIVE

## 2014-09-19 LAB — HEMOGLOBIN A1C
Hgb A1c MFr Bld: 5.7 % — ABNORMAL HIGH (ref <5.7)
Mean Plasma Glucose: 117 mg/dL — ABNORMAL HIGH (ref <117)

## 2014-09-19 LAB — T4, FREE: Free T4: 1.51 ng/dL (ref 0.80–1.80)

## 2014-09-19 LAB — MICROALBUMIN, URINE: MICROALB UR: 0.5 mg/dL (ref <2.0)

## 2014-09-19 LAB — RPR

## 2014-09-19 LAB — HIV ANTIBODY (ROUTINE TESTING W REFLEX): HIV: NONREACTIVE

## 2014-09-19 MED ORDER — SERTRALINE HCL 50 MG PO TABS: 50.0000 mg | ORAL_TABLET | Freq: Every day | ORAL | Status: DC

## 2014-09-19 NOTE — Progress Notes (Signed)
Subjective:    Patient ID: Kara Rogers, female    DOB: Nov 14, 1931, 81 y.o.   MRN: [id number]  09/19/2014  Annual Exam; Altered Mental Status; and Depression   HPI This 81 y.o. female presents for Annual Wellness Examination and Complete Physical Examination.  Last physical: 04-06-2013 Pap smear:  No longer warranted due to age. Mammogram:  06-30-14 Colonoscopy:  07-27-2007 Bone density:  12-25-2010; needs to reschedule. Had to cancel. [id number] Pneumovax:  2008; ; Prevnar 13 2015 Zostavax:  Never; +chicken pox.   Influenza:  11-03-2013 Eye exam:  +glasses; yearly; no glaucoma or cataracts.  10/2013.  [place]. Dental exam:  Going this month.   HTN: Patient reports good compliance with medication, good tolerance to medication, and good symptom control.  BP running 170-180/62-72  Pulse 60.  DMII:  Patient reports good compliance with medication, good tolerance to medication, and good symptom control.  Sugars running 124-126.  Taking Metformin 2 daily total.  Hyperlipidemia:  Patient reports good compliance with medication, good tolerance to medication, and good symptom control.    Hypothyroidism:Patient reports good compliance with medication, good tolerance to medication, and good symptom control.    Urge Incontinence:  Patient reports good compliance with medication, good tolerance to medication, and good symptom control.  Taking tid; working well.  Not leaking.  Urinating a lot during the day.  Nocturia x 2.  Depression: Patient reports good compliance with medication, good tolerance to medication, and good symptom control.  No excessive crying.  Not enjoying things.  Likes to read.  Staying at home mostly.  Watching television. Going to church; attends church functions.  Does not drive at night.  Driving during the day.  Grocery store.   Not acting like self.    Confusion: sometimes confused; the other day, came to beauty shop and passed location; knew had passed shop.  Forgets  train of thought. More frequent now.  Worse since had UTI in 06/2014.  No more hallucinations.  Daughter in law does not want her living alone.  [place] neighbors[place]  Family worries about pt's home being off road. Has life line.  Pt is not ready to move.  Lots of memories with house.  Closest family member is 25 minutes away; son in [place]; two daughters in [place].  Review of Systems  Constitutional: Negative for fever, chills, diaphoresis, activity change, appetite change, fatigue and unexpected weight change.  HENT: Negative for congestion, dental problem, drooling, ear discharge, ear pain, facial swelling, hearing loss, mouth sores, nosebleeds, postnasal drip, rhinorrhea, sinus pressure, sneezing, sore throat, tinnitus, trouble swallowing and voice change.   Eyes: Negative for photophobia, pain, discharge, redness, itching and visual disturbance.  Respiratory: Negative for apnea, cough, choking, chest tightness, shortness of breath, wheezing and stridor.   Cardiovascular: Negative for chest pain, palpitations and leg swelling.  Gastrointestinal: Negative for nausea, vomiting, abdominal pain, diarrhea, constipation, blood in stool, abdominal distention, anal bleeding and rectal pain.  Endocrine: Negative for cold intolerance, heat intolerance, polydipsia, polyphagia and polyuria.  Genitourinary: Negative for dysuria, urgency, frequency, hematuria, flank pain, decreased urine volume, vaginal bleeding, vaginal discharge, enuresis, difficulty urinating, genital sores, vaginal pain, menstrual problem, pelvic pain and dyspareunia.  Musculoskeletal: Negative for myalgias, back pain, joint swelling, arthralgias, gait problem, neck pain and neck stiffness.  Skin: Negative for color change, pallor, rash and wound.  Allergic/Immunologic: Negative for environmental allergies, food allergies and immunocompromised state.  Neurological: Negative for dizziness, tremors, seizures, syncope, facial  asymmetry, speech difficulty, weakness, light-headedness, numbness and headaches.  Hematological: Negative for adenopathy. Does not bruise/bleed easily.  Psychiatric/Behavioral: Positive for confusion and dysphoric mood. Negative for suicidal ideas, hallucinations, behavioral problems, sleep disturbance, self-injury, decreased concentration and agitation. The patient is not nervous/anxious and is not hyperactive.     Past Medical History  Diagnosis Date  . Essential hypertension, benign   . Pure hypercholesterolemia   . Type II or unspecified type diabetes mellitus without mention of complication, not stated as uncontrolled   . Osteoporosis   . Leg cramps   . Unspecified disorder of skin and subcutaneous tissue   . Unspecified hypothyroidism   . Unspecified vitamin D deficiency   . Personal history of colonic polyps   . Hypercalcemia   . Chicken pox   . Measles   . Mumps    Past Surgical History  Procedure Laterality Date  . Neck surgery      L neck cyst resection  . Thyroid cyst excision    . Hernia repair  1981  . Cataract surgery  02/2011    Left; Right 2009  . Breast ductal resection  02/2010    Left-[place]. Resection of thickened breast duct. Symptoms: L breast drainage hemocult +  . Abdominal hysterectomy  1980    R Ovary intact.  DUB.  Marland Kitchen Cholecystectomy     No Known Allergies Current Outpatient Prescriptions  Medication Sig Dispense Refill  . alendronate (FOSAMAX) 70 MG tablet Take 1 tablet (70 mg total) by mouth every 7 (seven) days. Take with a full glass of water on an empty stomach. 12 tablet 3  . amLODipine (NORVASC) 10 MG tablet Take 1 tablet (10 mg total) by mouth daily. 90 tablet 3  . Ascorbic Acid (VITAMIN C) 1000 MG tablet Take 1,000 mg by mouth daily.    Marland Kitchen aspirin 81 MG tablet Take 81 mg by mouth daily.    . cholecalciferol (VITAMIN D) 1000 UNITS tablet Take 1,000 Units by mouth daily.    . citalopram (CELEXA) 20 MG tablet Take 1 tablet (20 mg total) by  mouth daily. 90 tablet 3  . glucose blood test strip Test blood sugar daily. Dx code: E11.9 100 each 3  . Lancets MISC Test blood sugar daily. Dx code: 250.00 100 each 3  . levothyroxine (SYNTHROID, LEVOTHROID) 50 MCG tablet Take 1 tablet (50 mcg total) by mouth daily. 90 tablet 3  . losartan-hydrochlorothiazide (HYZAAR) 100-25 MG per tablet Take 1 tablet by mouth daily. 90 tablet 3  . metFORMIN (GLUCOPHAGE) 500 MG tablet Take 1 tablet (500 mg total) by mouth 2 (two) times daily with a meal. 180 tablet 3  . metoprolol succinate (TOPROL-XL) 50 MG 24 hr tablet 1 tablet daily 90 tablet 3  . Multiple Vitamin (MULTIVITAMINS PO) Take by mouth.    Marland Kitchen omeprazole (PRILOSEC) 20 MG capsule Take 1 capsule (20 mg total) by mouth daily. For indigestion. 90 capsule 1  . oxybutynin (DITROPAN) 5 MG tablet Take 1 tablet (5 mg total) by mouth 3 (three) times daily. 270 tablet 3  . PRESCRIPTION MEDICATION T/S Bayer Contour Test Strips 100    . simvastatin (ZOCOR) 20 MG tablet Take 1 tablet (20 mg total) by mouth every evening. 90 tablet 3  . amoxicillin-clavulanate (AUGMENTIN) 875-125 MG per tablet Take 1 tablet by mouth 2 (two) times daily. 20 tablet 0  . sertraline (ZOLOFT) 50 MG tablet Take 1 tablet (50 mg total) by mouth daily. 90 tablet 3   No current  facility-administered medications for this visit.   Social History   Social History  . Marital Status: Married    Spouse Name: Pamelia Hoit  . Number of Children: 3  . Years of Education: 12   Occupational History  . retired     Aug 02, 2006   Social History Main Topics  . Smoking status: Never Smoker   . Smokeless tobacco: Not on file  . Alcohol Use: No  . Drug Use: No  . Sexual Activity: Not Currently   Other Topics Concern  . Not on file   Social History Narrative   Marital status: widowed x 2;  Second husband died in 08/02/14; first husband died 08/02/2002 (59 years of marriage). Happily married;no abuse.      [place]      Children:  3  children (67,51,41) son in [place], 2 daughters in [place];  5 grandchildren, 5 gg.        Tobacco: none       Alcohol: none        Exercise: Walking 45-60 minutes. 3 x week; exercise program with church.       Living Will:  Patient DOES have Living Will; +CPR but DNI.      ADLs:   Independent with all ADL's; drives.  No assistant devices.  No falls in 01-Aug-2012.        Caffeine:  Consumes a minimal amount of caffeine.      Home safety:  Smoke alarms and carbon monoxide detector in the home. +Life Line      Seatbelts:  Always uses seat belts.      Organ donor:  No.       Guns:  Guns in the home: No unsecured guns in the home.    Family History  Problem Relation Age of Onset  . Cancer Father   . Seizures Sister     Epilepsy  . Cancer Sister     breast  . Cirrhosis Brother     deceased  . Osteoporosis Mother   . Arthritis Mother   . Diabetes Sister     insulin dependent       Objective:    BP 164/75 mmHg  Pulse 59  Temp(Src) 98 F (36.7 C) (Oral)  Resp 16  Ht 5' 2.5" (1.588 m)  Wt 183 lb 9.6 oz (83.28 kg)  BMI 33.02 kg/m2  SpO2 95% Physical Exam  Constitutional: She is oriented to person, place, and time. She appears well-developed and well-nourished. No distress.  HENT:  Head: Normocephalic and atraumatic.  Right Ear: External ear normal.  Left Ear: External ear normal.  Nose: Nose normal.  Mouth/Throat: Oropharynx is clear and moist.  Eyes: Conjunctivae and EOM are normal. Pupils are equal, round, and reactive to light.  Neck: Normal range of motion and full passive range of motion without pain. Neck supple. No JVD present. Carotid bruit is not present. No thyromegaly present.  Cardiovascular: Normal rate, regular rhythm and normal heart sounds.  Exam reveals no gallop and no friction rub.   No murmur heard. Pulmonary/Chest: Effort normal and breath sounds normal. She has no wheezes. She has no rales. Right breast exhibits no inverted nipple, no mass, no nipple  discharge, no skin change and no tenderness. Left breast exhibits no inverted nipple, no mass, no nipple discharge, no skin change and no tenderness. Breasts are symmetrical.  Abdominal: Soft. Bowel sounds are normal. She exhibits no distension and no mass. There is no tenderness.  There is no rebound and no guarding.  Musculoskeletal:       Right shoulder: Normal.       Left shoulder: Normal.       Cervical back: Normal.  Lymphadenopathy:    She has no cervical adenopathy.  Neurological: She is alert and oriented to person, place, and time. She has normal reflexes. No cranial nerve deficit. She exhibits normal muscle tone. Coordination normal.  Skin: Skin is warm and dry. No rash noted. She is not diaphoretic. No erythema. No pallor.  Psychiatric: She has a normal mood and affect. Her behavior is normal. Judgment and thought content normal.  Nursing note and vitals reviewed.       Assessment & Plan:   1. Encounter for Medicare annual wellness exam   2. Urinary incontinence, urge   3. Pure hypercholesterolemia   4. Osteopenia   5. Essential hypertension, benign   6. Subacute confusional state   7. Type 2 diabetes mellitus without complication   8. Hypothyroidism due to acquired atrophy of thyroid   9. Physical deconditioning   10. Chest pressure     1. Medicare Annual Wellness Examination and CPE: anticipatory guidance --- weight loss, exercise; recommend formal PT.  No longer warrants pap smears. Mammogram and colonoscopy UTD.  Plans to reschedule bone density scan.  Immunizations reviewed; pt declines Zostavax.  Independent with ADLs but now needing some support.  Has LifeLine. No recent falls yet moderate fall risk due to confusion and deconditioning.  Has living will and HCPOA. 2.  Urge incontinence: controlled with medication. 3.  Hypercholesterolemia: controlled; obtain labs; refills provided. 4.  Osteopenia: stable; continue Fosamax; to undergo bone density scan. 5.  HTN:  stable; moderate control; to confirm that taking all medications; refills provided. 6.  Subacute confusional state:  Persistent; check urine culture.  Treat depression better.  If persists, refer to neurology. 7.  DMII: controlled; obtain labs; refills provided. 8.  Hypothyroidism: controlled; obtain labs; refills provided. 9. Physical deconditioning: New.  Refer for formal PT. 10.  Chest pressure: New.  Stable EKG.  If recurs, to ED. Atypical in nature; not suggestive of cardiac etiology. 11.  Depression; uncontrolled; wean Celexa; start Zoloft  daily.   Meds ordered this encounter  Medications  . sertraline (ZOLOFT) 50 MG tablet    Sig: Take 1 tablet (50 mg total) by mouth daily.    Dispense:  90 tablet    Refill:  3  . amoxicillin-clavulanate (AUGMENTIN) 875-125 MG per tablet    Sig: Take 1 tablet by mouth 2 (two) times daily.    Dispense:  20 tablet    Refill:  0  . alendronate (FOSAMAX) 70 MG tablet    Sig: Take 1 tablet (70 mg total) by mouth every 7 (seven) days. Take with a full glass of water on an empty stomach.    Dispense:  12 tablet    Refill:  3  . amLODipine (NORVASC) 10 MG tablet    Sig: Take 1 tablet (10 mg total) by mouth daily.    Dispense:  90 tablet    Refill:  3  . levothyroxine (SYNTHROID, LEVOTHROID) 50 MCG tablet    Sig: Take 1 tablet (50 mcg total) by mouth daily.    Dispense:  90 tablet    Refill:  3  . losartan-hydrochlorothiazide (HYZAAR) 100-25 MG per tablet    Sig: Take 1 tablet by mouth daily.    Dispense:  90 tablet    Refill:  3  .  metFORMIN (GLUCOPHAGE) 500 MG tablet    Sig: Take 1 tablet (500 mg total) by mouth 2 (two) times daily with a meal.    Dispense:  180 tablet    Refill:  3  . metoprolol succinate (TOPROL-XL) 50 MG 24 hr tablet    Sig: 1 tablet daily    Dispense:  90 tablet    Refill:  3  . oxybutynin (DITROPAN) 5 MG tablet    Sig: Take 1 tablet (5 mg total) by mouth 3 (three) times daily.    Dispense:  270 tablet     Refill:  3  . simvastatin (ZOCOR) 20 MG tablet    Sig: Take 1 tablet (20 mg total) by mouth every evening.    Dispense:  90 tablet    Refill:  3    Return in about 3 months (around 12/20/2014) for recheck.    Kristi Paulita FujitaMartin Smith, M.D. [facility]  [place] [address] [place], [place]  [id number] [phone] phone [phone] fax

## 2014-09-19 NOTE — Patient Instructions (Signed)
1. Confirm that you are still taking Amlodipine  daily for blood pressure (also confirm that you are taking Metoprolol and Losartan/HCTZ)

## 2014-09-21 LAB — URINE CULTURE: Colony Count: >=100000

## 2014-09-22 ENCOUNTER — Ambulatory Visit
Admission: RE | Admit: 2014-09-22 | Discharge: 2014-09-22 | Disposition: A | Discharge status: Home / Self Care | Payer: Medicare Other | Source: Ambulatory Visit | Attending: Family Medicine | Admitting: Family Medicine

## 2014-09-22 DIAGNOSIS — Z780 Asymptomatic menopausal state: Secondary | ICD-10-CM | POA: Diagnosis not present

## 2014-09-22 DIAGNOSIS — M8589 Other specified disorders of bone density and structure, multiple sites: Secondary | ICD-10-CM | POA: Diagnosis not present

## 2014-09-22 DIAGNOSIS — Z029 Encounter for administrative examinations, unspecified: Secondary | ICD-10-CM | POA: Diagnosis present

## 2014-09-22 DIAGNOSIS — M810 Age-related osteoporosis without current pathological fracture: Secondary | ICD-10-CM

## 2014-09-24 MED ORDER — AMOXICILLIN-POT CLAVULANATE 875-125 MG PO TABS: 1.0000 | ORAL_TABLET | Freq: Two times a day (BID) | ORAL | Status: DC

## 2014-09-26 ENCOUNTER — Ambulatory Visit: Payer: Medicare Other | Attending: Family Medicine

## 2014-09-26 VITALS — BP 156/57 | HR 58

## 2014-09-26 DIAGNOSIS — R2689 Other abnormalities of gait and mobility: Secondary | ICD-10-CM

## 2014-09-26 DIAGNOSIS — R531 Weakness: Secondary | ICD-10-CM | POA: Diagnosis not present

## 2014-09-26 DIAGNOSIS — R29818 Other symptoms and signs involving the nervous system: Secondary | ICD-10-CM | POA: Diagnosis not present

## 2014-09-26 NOTE — Therapy (Signed)
[facility] PHYSICAL AND SPORTS MEDICINE [address], [place], [address] Phone: [phone]   Fax:  [phone]  Physical Therapy Evaluation  Patient Details  Name: Kara Rogers MRN: [id number] Date of Birth: Jul 30, 1931 Referring Provider:  Ethelda Chick, MD  Encounter Date: 09/26/2014      PT End of Session - 09/26/14 1404    Visit Number 1   Number of Visits 8   Date for PT Re-Evaluation 11/30/2014   Authorization Type g codes   PT Start Time 1015   PT Stop Time 1115   PT Time Calculation (min) 60 min   Equipment Utilized During Treatment Gait belt   Activity Tolerance Patient tolerated treatment well   Behavior During Therapy [facility] for tasks assessed/performed      Past Medical History  Diagnosis Date  . Essential hypertension, benign   . Pure hypercholesterolemia   . Type II or unspecified type diabetes mellitus without mention of complication, not stated as uncontrolled   . Osteoporosis   . Leg cramps   . Unspecified disorder of skin and subcutaneous tissue   . Unspecified hypothyroidism   . Unspecified vitamin D deficiency   . Personal history of colonic polyps   . Hypercalcemia   . Chicken pox   . Measles   . Mumps     Past Surgical History  Procedure Laterality Date  . Neck surgery      L neck cyst resection  . Thyroid cyst excision    . Hernia repair  1981  . Cataract surgery  02/2011    Left; Right 2009  . Breast ductal resection  02/2010    Left-[place]. Resection of thickened breast duct. Symptoms: L breast drainage hemocult +  . Abdominal hysterectomy  1980    R Ovary intact.  DUB.  [facility] Kitchen Cholecystectomy      Filed Vitals:   09/26/14 1042 09/26/14 1054  BP: 149/122 156/57  Pulse: 59 58  SpO2: 100% 98%    Visit Diagnosis:  Weakness generalized - Plan: PT plan of care cert/re-cert  Balance disorder - Plan: PT plan of care cert/re-cert      Subjective Assessment - 09/26/14 1049    Subjective  "I'm not really sure why I am here." Pt reports her MD told her the last 2 visits that she wanted to send her to physical therapy. Pt reports some worsening of strength since her UTI as well as some R knee pain.    Pertinent History Pt reports she fell 2 months ago but did not injure herself. She reports one other fall in the last 12 months. Other fall occurred in August of last year during which she hit her head but denies any further injury. Pt did not go to the hospital or get assessed at that time.  She is complaining of some "drifting to the left" while walking since her UTI which started back in April 2016. She states that she still has a UTI and they have placed her on 3 separate antibiotics but have failed to clear the infection. Pt states that she has been startled recently by objects in her left peripheral vision. She has a history of visual hallucinations with her last UTI but currently denies that her episodes of "startling" are due to hallucinations. "My mind is kind of slow." She is in the process of scheduling an appointment with her optometrist to have her vision checked as well. Pt reports she lost her second husband  the November 11th 2015 and that she had a hard time initially but it "is a lot better now." She has shared this with her physician who is managing her for depression. Her daughters would like for her to come and live with them and at times feel like it is unsafe for her to be at home.   Patient Stated Goals "I would like to have peace of mind with respect to my balance and strength."   Currently in Pain? No/denies            [facility] PT Assessment - 09/26/14 0001    Assessment   Medical Diagnosis Physical deconditioning (R53.81)   Onset Date/Surgical Date 06/03/14   Next MD Visit October 18th, 2016   Prior Therapy No   Precautions   Precautions None   Restrictions   Weight Bearing Restrictions No   Balance Screen   Has the patient fallen in the past 6 months Yes   How  many times? 2 falls in the last 12 months   Has the patient had a decrease in activity level because of a fear of falling?  Yes   Is the patient reluctant to leave their home because of a fear of falling?  No   Home Environment   Living Environment Private residence   Living Arrangements Alone   Available Help at Discharge Family   Type of Home House   Home Access Stairs to enter   Entrance Stairs-Number of Steps 1   Entrance Stairs-Rails Can reach both  Posts to hold   Home Layout One level   Home Equipment [place] - single point;Shower seat;Grab bars - tub/shower   Prior Function   Level of Independence Independent   Cognition   Overall Cognitive Status Within Functional Limits for tasks assessed   Observation/Other Assessments   Observations Overweight female with forward head posture   Sensation   Light Touch Appears Intact  Gross screen UE/LE   Posture/Postural Control   Posture Comments Negative Hoffman and negative clonus UE/LE   ROM / Strength   AROM / PROM / Strength Strength   Strength   Overall Strength Within functional limits for tasks performed  at least 4 to 4+/5 throughout   Transfers   Transfers --   Five time sit to stand comments  19.7 sec, above cut-off   Comments TUG: 9.8 seconds, WNL   Ambulation/Gait   Gait velocity 10'=9.0 sec; 1.11 m/sec  Close to full community ambulation   6 Minute Walk- Baseline   6 Minute Walk- Baseline yes   BP (mmHg) 156/57 mmHg   HR (bpm) 58   02 Sat (%RA) 98 %   6 Minute walk- Post Test   6 Minute Walk Post Test yes   BP (mmHg) 131/85 mmHg   HR (bpm) 64   02 Sat (%RA) 99 %   Perceived Rate of Exertion (Borg) 15- Hard   6 minute walk test results    Aerobic Endurance Distance Walked 1000   Endurance additional comments Below norm of 1286 for community dwelling females of comparable age   Balance   Balance Assessed Yes   Standardized Balance Assessment   Standardized Balance Assessment Berg Balance Test   Berg Balance  Test   Sit to Stand Able to stand without using hands and stabilize independently   Standing Unsupported Able to stand safely 2 minutes   Sitting with Back Unsupported but Feet Supported on Floor or Stool Able to sit safely and securely 2  minutes   Stand to Sit Sits safely with minimal use of hands   Transfers Able to transfer safely, minor use of hands   Standing Unsupported with Eyes Closed Able to stand 10 seconds with supervision   Standing Ubsupported with Feet Together Able to place feet together independently and stand 1 minute safely   From Standing, Reach Forward with Outstretched Arm Can reach forward >12 cm safely (5")   From Standing Position, Pick up Object from Floor Able to pick up shoe safely and easily   From Standing Position, Turn to Look Behind Over each Shoulder Looks behind from both sides and weight shifts well   Turn 360 Degrees Able to turn 360 degrees safely in 4 seconds or less   Standing Unsupported, Alternately Place Feet on Step/Stool Able to stand independently and safely and complete 8 steps in 20 seconds   Standing Unsupported, One Foot in Front Able to plae foot ahead of the other independently and hold 30 seconds   Standing on One Leg Tries to lift leg/unable to hold 3 seconds but remains standing independently   Total Score 50      There-ex: Educated pt about HEP and furnished with written handout. Performed sit to stand x 10 and standing slow marches x 10 with patient while providing verbal and tactile feedback.                     PT Education - 09/26/14 1404    Education provided Yes   Education Details HEP   Person(s) Educated Patient   Methods Explanation;Demonstration;Handout;Verbal cues   Comprehension Verbalized understanding;Returned demonstration             PT Long Term Goals - 09/26/14 1409    PT LONG TERM GOAL #1   Title Pt will demonstrate increase in by at least 164' in order to demonstrate improved  endurance by 10/31/14   Baseline 09/26/14: 1000'   Status New   PT LONG TERM GOAL #2   Title Pt will improve BERG by >3 points in order to improve balance and decrease risk of falls by 10/31/14   Baseline 09/26/14: 50/56   Status New   PT LONG TERM GOAL #3   Title Pt will decrease 5TSTS to below 15 seconds in order to demonstrate improved LE power and decrease risk for falls by 10/31/14   Baseline 09/26/14: 19.7 seconds   Status New   PT LONG TERM GOAL #4   Title Pt will be independent with HEP in order to improve strength and decrease risk of falls by 10/31/14   Status New               Plan - 09/26/14 1405    Clinical Impression Statement Pt is a pleasant 81 year-old female referred for physical deconditioning. PT evaluation reveals decreased strength, power, and endurance as noted on 6 minute walk test and 5 time sit to stand. In addition pt with decreased balance scoring 50/56 on the BERG with most difficulty performing single leg stance balance. Pt will benefit from skilled PT services to address deficits in balance, strength, and mobility in order to work towards patient's goal of continued independence at home.    Pt will benefit from skilled therapeutic intervention in order to improve on the following deficits Abnormal gait;Decreased balance;Decreased strength   Rehab Potential Good   Clinical Impairments Affecting Rehab Potential Positive: motivation, Negative: slowed cognition, age   PT Frequency 2x / week  PT Duration 4 weeks   PT Treatment/Interventions ADLs/Self Care Home Management;Aquatic Therapy;DME Instruction;Gait training;Stair training;Functional mobility training;Therapeutic activities;Therapeutic exercise;Balance training;Neuromuscular re-education;Cognitive remediation;Patient/family education;Manual techniques   PT Next Visit Plan ABC, MOCA, check BP, progress strengthening program and HEP   PT Home Exercise Plan Sit to stand and slow marching    Consulted and  Agree with Plan of Care Patient          G-Codes - 10-03-14 1413    Functional Assessment Tool Used BERG, 5TSTS, TUG, 10 meter walk,   Functional Limitation Mobility: Walking and moving around   Mobility: Walking and Moving Around Current Status [phone]) At least 20 percent but less than 40 percent impaired, limited or restricted   Mobility: Walking and Moving Around Goal Status [phone]) At least 1 percent but less than 20 percent impaired, limited or restricted       Problem List Patient Active Problem List   Diagnosis Date Noted  . Acute delirium 06/23/2014  . Hallucinations, visual 06/23/2014  . UTI (lower urinary tract infection) 06/23/2014  . Unspecified hypothyroidism 02/21/2012  . Routine general medical examination at a health care facility 02/21/2012  . Routine gynecological examination 02/21/2012  . Urinary incontinence, urge 02/21/2012  . Type II or unspecified type diabetes mellitus without mention of complication, not stated as uncontrolled 11/18/2011  . Pure hypercholesterolemia 11/18/2011  . Essential hypertension, benign 11/18/2011  . Osteopenia 11/18/2011    Lynnea Maizes PT, DPT   Faraaz Wolin 2014/10/03, 2:16 PM  [facility] PHYSICAL AND SPORTS MEDICINE [address], [place], [address] Phone: [phone]   Fax:  [phone]

## 2014-09-26 NOTE — Patient Instructions (Signed)
Sit to Stand (Sitting)   Sit on chair. Lean forward. Stand up without using hands. Perform with chair in front of you for safety or other solid surface. Repeat _10__ times, repeat 2 more times. Do _2__ times a day.  Marching In-Place   Standing straight, alternate bringing knees toward trunk. Perform slowly. Goals is to be able to achieve 5 second balance on one leg. Do _10_ repetitions/leg and repeat 2 times on each side. Do _2_ times per day.

## 2014-09-29 ENCOUNTER — Ambulatory Visit: Payer: Medicare Other

## 2014-09-29 VITALS — BP 144/70 | HR 60

## 2014-09-29 DIAGNOSIS — R531 Weakness: Secondary | ICD-10-CM | POA: Diagnosis not present

## 2014-09-29 DIAGNOSIS — R29818 Other symptoms and signs involving the nervous system: Secondary | ICD-10-CM | POA: Diagnosis not present

## 2014-09-29 DIAGNOSIS — R2689 Other abnormalities of gait and mobility: Secondary | ICD-10-CM

## 2014-09-29 NOTE — Therapy (Signed)
[facility]ORTS MEDICINE [address], [place], [address] Phone: [phone]   Fax:  [phone]  Physical Therapy Treatment  Patient Details  Name: Kara Rogers MRN: [id number] Date of Birth: 1931/08/14 Referring Provider:  Ethelda Chick, MD  Encounter Date: 09/29/2014      PT End of Session - 09/29/14 1353    Visit Number 2   Number of Visits 8   Date for PT Re-Evaluation 2014-11-24   Authorization Type g codes   PT Start Time 1345   PT Stop Time 1430   PT Time Calculation (min) 45 min   Equipment Utilized During Treatment Gait belt   Activity Tolerance Patient tolerated treatment well   Behavior During Therapy [facility] for tasks assessed/performed      Past Medical History  Diagnosis Date  . Essential hypertension, benign   . Pure hypercholesterolemia   . Type II or unspecified type diabetes mellitus without mention of complication, not stated as uncontrolled   . Osteoporosis   . Leg cramps   . Unspecified disorder of skin and subcutaneous tissue   . Unspecified hypothyroidism   . Unspecified vitamin D deficiency   . Personal history of colonic polyps   . Hypercalcemia   . Chicken pox   . Measles   . Mumps     Past Surgical History  Procedure Laterality Date  . Neck surgery      L neck cyst resection  . Thyroid cyst excision    . Hernia repair  1981  . Cataract surgery  02/2011    Left; Right 2009  . Breast ductal resection  02/2010    Left-[place]. Resection of thickened breast duct. Symptoms: L breast drainage hemocult +  . Abdominal hysterectomy  1980    R Ovary intact.  DUB.  [facility] Cholecystectomy      Filed Vitals:   09/29/14 1413  BP: 144/70  Pulse: 60  SpO2: 97%    Visit Diagnosis:  Weakness generalized  Balance disorder      Subjective Assessment - 09/29/14 1351    Subjective Pt states she is doing well today. She is performing HEP without issue. No specific questions or  concerns at this time.    Pertinent History Pt reports she fell 2 months ago but did not injure herself. She reports one other fall in the last 12 months. Other fall occurred in August of last year during which she hit her head but denies any further injury. Pt did not go to the hospital or get assessed at that time.  She is complaining of some "drifting to the left" while walking since her UTI which started back in April 2016. She states that she still has a UTI and they have placed her on 3 separate antibiotics but have failed to clear the infection. Pt states that she has been startled recently by objects in her left peripheral vision. She has a history of visual hallucinations with her last UTI but currently denies that her episodes of "startling" are due to hallucinations. "My mind is kind of slow." She is in the process of scheduling an appointment with her optometrist to have her vision checked as well. Pt reports she lost her second husband the November 11th 2015 and that she had a hard time initially but it "is a lot better now." She has shared this with her physician who is managing her for depression. Her daughters would like for her to come and  live with them and at times feel like it is unsafe for her to be at home.   Patient Stated Goals "I would like to have peace of mind with respect to my balance and strength."   Currently in Pain? No/denies         OBJECTIVE: NuStep L3 x 5 minutes during HEP review and history (2 min unbilled); Completed ABC: 48.75%  and MOCA: 20/30; Performed sit to stand from regular height chair x 10; Slow marching with extended holds to challenge single leg balance x 10 each leg; Airex balance in wide/narrow stance with eyes open/closed x 30 seconds in each condition; Airex toe taps to 4" step x 10 bilateral; Narrow stance Airex balance with horizontal head turns; Body weight squats x 5, pt reports some R knee pain, modifications will be made at next visit to  reduce pain;                        PT Education - 09/29/14 1352    Education provided Yes   Education Details HEP reinforced   Person(s) Educated Patient   Methods Explanation   Comprehension Verbalized understanding             PT Long Term Goals - 09/29/14 1434    PT LONG TERM GOAL #1   Title Pt will demonstrate increase in by at least 164' in order to demonstrate improved endurance by 10/31/14   Baseline 09/26/14: 1000'   Status New   PT LONG TERM GOAL #2   Title Pt will improve BERG by >3 points in order to improve balance and decrease risk of falls by 10/31/14   Baseline 09/26/14: 50/56   Status New   PT LONG TERM GOAL #3   Title Pt will decrease 5TSTS to below 15 seconds in order to demonstrate improved LE power and decrease risk for falls by 10/31/14   Baseline 09/26/14: 19.7 seconds   Status New   PT LONG TERM GOAL #4   Title Pt will be independent with HEP in order to improve strength and decrease risk of falls by 10/31/14   Status New   PT LONG TERM GOAL #5   Title Pt will demonstrate improvement on ABC to >67% to improve balance confidence and decrease fear of falling by 10/31/14   Baseline 09/29/14: 48.75%   Status New               Plan - 09/29/14 1353    Clinical Impression Statement Pt demonstrates good participation with therapy today. She is able to complete all exercises as instructed. Pt with some cognitive deficts scoring 20/30 on MOCA. In addition pt with decreased balance confidence scoring a 48.75% on ABC. No modifications made to HEP on this date. Pt encouraged to continue current HEP and follow-up as scheduled.    Pt will benefit from skilled therapeutic intervention in order to improve on the following deficits Abnormal gait;Decreased balance;Decreased strength   Rehab Potential Good   Clinical Impairments Affecting Rehab Potential Positive: motivation, Negative: slowed cognition, age   PT Frequency 2x / week   PT Duration  4 weeks   PT Treatment/Interventions ADLs/Self Care Home Management;Aquatic Therapy;DME Instruction;Gait training;Stair training;Functional mobility training;Therapeutic activities;Therapeutic exercise;Balance training;Neuromuscular re-education;Cognitive remediation;Patient/family education;Manual techniques   PT Next Visit Plan Progress strengthening and balance program.    PT Home Exercise Plan Sit to stand and slow marching    Consulted and Agree with Plan of Care Patient  Problem List Patient Active Problem List   Diagnosis Date Noted  . Acute delirium 06/23/2014  . Hallucinations, visual 06/23/2014  . UTI (lower urinary tract infection) 06/23/2014  . Unspecified hypothyroidism 02/21/2012  . Routine general medical examination at a health care facility 02/21/2012  . Routine gynecological examination 02/21/2012  . Urinary incontinence, urge 02/21/2012  . Type II or unspecified type diabetes mellitus without mention of complication, not stated as uncontrolled 11/18/2011  . Pure hypercholesterolemia 11/18/2011  . Essential hypertension, benign 11/18/2011  . Osteopenia 11/18/2011   Lynnea Maizes PT, DPT   Abdulla Pooley 09/29/2014, 2:37 PM  [facility] PHYSICAL AND SPORTS MEDICINE [address], [place], [address] Phone: [phone]   Fax:  [phone]

## 2014-10-03 ENCOUNTER — Ambulatory Visit: Payer: Medicare Other | Attending: Family Medicine | Admitting: Physical Therapy

## 2014-10-03 DIAGNOSIS — R29818 Other symptoms and signs involving the nervous system: Secondary | ICD-10-CM | POA: Insufficient documentation

## 2014-10-03 DIAGNOSIS — R531 Weakness: Secondary | ICD-10-CM | POA: Insufficient documentation

## 2014-10-03 DIAGNOSIS — R2689 Other abnormalities of gait and mobility: Secondary | ICD-10-CM

## 2014-10-03 NOTE — Therapy (Signed)
[place] [facility] [address], [place], [address] Phone: [phone]   Fax:  [phone]  Physical Therapy Treatment  Patient Details  Name: Kara Rogers MRN: [id number] Date of Birth: 10-23-1931 Referring Provider:  Ethelda Chick, MD  Encounter Date: 10/03/2014      PT End of Session - 10/03/14 1435    Visit Number 3   Number of Visits 8   Date for PT Re-Evaluation 11/16/2014   Authorization Type g codes   PT Start Time 1400   PT Stop Time 1445   PT Time Calculation (min) 45 min   Equipment Utilized During Treatment Gait belt   Activity Tolerance Patient tolerated treatment well      Past Medical History  Diagnosis Date  . Essential hypertension, benign   . Pure hypercholesterolemia   . Type II or unspecified type diabetes mellitus without mention of complication, not stated as uncontrolled   . Osteoporosis   . Leg cramps   . Unspecified disorder of skin and subcutaneous tissue   . Unspecified hypothyroidism   . Unspecified vitamin D deficiency   . Personal history of colonic polyps   . Hypercalcemia   . Chicken pox   . Measles   . Mumps     Past Surgical History  Procedure Laterality Date  . Neck surgery      L neck cyst resection  . Thyroid cyst excision    . Hernia repair  1981  . Cataract surgery  02/2011    Left; Right 2009  . Breast ductal resection  02/2010    Left-[place]. Resection of thickened breast duct. Symptoms: L breast drainage hemocult +  . Abdominal hysterectomy  1980    R Ovary intact.  DUB.  Marland Kitchen Cholecystectomy      There were no vitals filed for this visit.  Visit Diagnosis:  Weakness generalized  Balance disorder      Subjective Assessment - 10/03/14 1403    Subjective Pt reports she is doing well so far. She reports she feels the sit<>stand exercise has been particularly helpful. No falls since starting therapy.   Currently in Pain? Yes   Pain Score 2    Pain Location Knee   Pain Orientation Right   Pain Onset More than a month ago           objective: Nu-step L2 x8 min with cuing to avoid knee valgus, avoid hip IR, and maintain steps per min above 60.  Sit<>stand 3x10 with cuing for avoiding valgus. Lunges on stairs to minimize knee pain present with sit<>stand 3x20 on each side. Standing knee flexion 3x10 on each side with cuing to maintain upright posture. Standing hip abduction, hip extension each 3x10.  Pt required rest breaks between bouts of standing exercises due to significant fatigue but with cuing able to perform exercises with appropriate attention.                      PT Education - 10/03/14 1411    Education provided Yes   Education Details HEP reinforced   Person(s) Educated Patient   Methods Explanation   Comprehension Verbalized understanding             PT Long Term Goals - 09/29/14 1434    PT LONG TERM GOAL #1   Title Pt will demonstrate increase in by at least 164' in order to demonstrate improved endurance by Nov 16, 2014   Baseline  09/26/14: 1000'   Status New   PT LONG TERM GOAL #2   Title Pt will improve BERG by >3 points in order to improve balance and decrease risk of falls by 10/31/14   Baseline 09/26/14: 50/56   Status New   PT LONG TERM GOAL #3   Title Pt will decrease 5TSTS to below 15 seconds in order to demonstrate improved LE power and decrease risk for falls by 10/31/14   Baseline 09/26/14: 19.7 seconds   Status New   PT LONG TERM GOAL #4   Title Pt will be independent with HEP in order to improve strength and decrease risk of falls by 10/31/14   Status New   PT LONG TERM GOAL #5   Title Pt will demonstrate improvement on ABC to >67% to improve balance confidence and decrease fear of falling by 10/31/14   Baseline 09/29/14: 48.75%   Status New               Plan - 10/03/14 1435    Clinical Impression Statement Good participation with therapy. Pt required  several rest breaks but this was apparentlydue to muscle weakness. Pt reported incr. confidence with balance following session and generally feels that she is able to do her daily activities with minimal difficult at this time.   Pt will benefit from skilled therapeutic intervention in order to improve on the following deficits Abnormal gait;Decreased balance;Decreased strength   Rehab Potential Good   Clinical Impairments Affecting Rehab Potential Positive: motivation, Negative: slowed cognition, age   PT Frequency 2x / week   PT Duration 4 weeks   PT Treatment/Interventions ADLs/Self Care Home Management;Aquatic Therapy;DME Instruction;Gait training;Stair training;Functional mobility training;Therapeutic activities;Therapeutic exercise;Balance training;Neuromuscular re-education;Cognitive remediation;Patient/family education;Manual techniques        Problem List Patient Active Problem List   Diagnosis Date Noted  . Acute delirium 06/23/2014  . Hallucinations, visual 06/23/2014  . UTI (lower urinary tract infection) 06/23/2014  . Unspecified hypothyroidism 02/21/2012  . Routine general medical examination at a health care facility 02/21/2012  . Routine gynecological examination 02/21/2012  . Urinary incontinence, urge 02/21/2012  . Type II or unspecified type diabetes mellitus without mention of complication, not stated as uncontrolled 11/18/2011  . Pure hypercholesterolemia 11/18/2011  . Essential hypertension, benign 11/18/2011  . Osteopenia 11/18/2011    Fisher,Benjamin 10/03/2014, 2:37 PM  [place] [facility] [address] [place], [address] Phone: [phone]   Fax:  [phone]

## 2014-10-05 ENCOUNTER — Ambulatory Visit: Payer: Medicare Other | Admitting: Physical Therapy

## 2014-10-05 DIAGNOSIS — R531 Weakness: Secondary | ICD-10-CM | POA: Diagnosis not present

## 2014-10-05 DIAGNOSIS — R29818 Other symptoms and signs involving the nervous system: Secondary | ICD-10-CM | POA: Diagnosis not present

## 2014-10-05 DIAGNOSIS — R2689 Other abnormalities of gait and mobility: Secondary | ICD-10-CM

## 2014-10-05 NOTE — Therapy (Signed)
[facility] [address], [place], [address] Phone: [phone]   Fax:  [phone]  Physical Therapy Treatment  Patient Details  Name: Kara Rogers MRN: [id number] Date of Birth: [birth year] Referring Provider:  Ethelda Chick, MD  Encounter Date: 10/05/2014      PT End of Session - 10/05/14 0825    Visit Number 4   Number of Visits 8   Date for PT Re-Evaluation 10/31/14   PT Start Time 0805   PT Stop Time 0845   PT Time Calculation (min) 40 min   Equipment Utilized During Treatment Gait belt   Activity Tolerance Patient tolerated treatment well      Past Medical History  Diagnosis Date  . Essential hypertension, benign   . Pure hypercholesterolemia   . Type II or unspecified type diabetes mellitus without mention of complication, not stated as uncontrolled   . Osteoporosis   . Leg cramps   . Unspecified disorder of skin and subcutaneous tissue   . Unspecified hypothyroidism   . Unspecified vitamin D deficiency   . Personal history of colonic polyps   . Hypercalcemia   . Chicken pox   . Measles   . Mumps     Past Surgical History  Procedure Laterality Date  . Neck surgery      L neck cyst resection  . Thyroid cyst excision    . Hernia repair  1981  . Cataract surgery  02/2011    Left; Right 2009  . Breast ductal resection  02/2010    Left-[place]. Resection of thickened breast duct. Symptoms: L breast drainage hemocult +  . Abdominal hysterectomy  1980    R Ovary intact.  DUB.  Marland Kitchen Cholecystectomy      There were no vitals filed for this visit.  Visit Diagnosis:  Weakness generalized  Balance disorder      Subjective Assessment - 10/05/14 0808    Subjective Pt reports no pain currently, she is having some knee pain off and on.    Currently in Pain? No/denies          Objective: Nu-step L2 x6 min (no charge) Step ups on 6" step 3x10 on each side. Partial squat  3x10 Hip abduction 3x10 each side Leg press 1x10 25#, 2x10 35#.  Pt required cuing throughout to participate with therapy, to avoid pain with valgus with step ups on 6" step.  Pt wanted to continue with therapy but PT limited due to incr. Impairment in gait/poor balance.                       PT Education - 10/05/14 0825    Education provided Yes   Education Details educated pt on "good pain" (in quads from exercise) vs "bad pain" (in knee joint)   Person(s) Educated Patient   Methods Explanation   Comprehension Verbalized understanding             PT Long Term Goals - 09/29/14 1434    PT LONG TERM GOAL #1   Title Pt will demonstrate increase in by at least 164' in order to demonstrate improved endurance by 10/31/14   Baseline 09/26/14: 1000'   Status New   PT LONG TERM GOAL #2   Title Pt will improve BERG by >3 points in order to improve balance and decrease risk of falls by 10/31/14   Baseline 09/26/14: 50/56   Status New  PT LONG TERM GOAL #3   Title Pt will decrease 5TSTS to below 15 seconds in order to demonstrate improved LE power and decrease risk for falls by 10/31/14   Baseline 09/26/14: 19.7 seconds   Status New   PT LONG TERM GOAL #4   Title Pt will be independent with HEP in order to improve strength and decrease risk of falls by 10/31/14   Status New   PT LONG TERM GOAL #5   Title Pt will demonstrate improvement on ABC to >67% to improve balance confidence and decrease fear of falling by 10/31/14   Baseline 09/29/14: 48.75%   Status New               Plan - 10/05/14 0835    Clinical Impression Statement Pt reported incr. pain in knees with stairs, able to correct with cuing to avoid valgus/improve hip control. Ended session early due to difficulty with hip control following leg press indicating signficant weakness - PT did not want pt to get so tired she was at greater risk of falls.   Pt will benefit from skilled therapeutic  intervention in order to improve on the following deficits Abnormal gait;Decreased balance;Decreased strength   Rehab Potential Good        Problem List Patient Active Problem List   Diagnosis Date Noted  . Acute delirium 06/23/2014  . Hallucinations, visual 06/23/2014  . UTI (lower urinary tract infection) 06/23/2014  . Unspecified hypothyroidism 02/21/2012  . Routine general medical examination at a health care facility 02/21/2012  . Routine gynecological examination 02/21/2012  . Urinary incontinence, urge 02/21/2012  . Type II or unspecified type diabetes mellitus without mention of complication, not stated as uncontrolled 11/18/2011  . Pure hypercholesterolemia 11/18/2011  . Essential hypertension, benign 11/18/2011  . Osteopenia 11/18/2011    Valena Ivanov 10/05/2014, 8:51 AM  [place] [facility] SPORTS MEDIC[facility] [address] [place], [address] Phone: [phone]   Fax:  [phone]

## 2014-10-06 ENCOUNTER — Encounter: Payer: Self-pay | Admitting: Physical Therapy

## 2014-10-11 ENCOUNTER — Ambulatory Visit: Payer: Medicare Other | Admitting: Physical Therapy

## 2014-10-11 DIAGNOSIS — R531 Weakness: Secondary | ICD-10-CM | POA: Diagnosis not present

## 2014-10-11 DIAGNOSIS — R2689 Other abnormalities of gait and mobility: Secondary | ICD-10-CM

## 2014-10-11 DIAGNOSIS — R29818 Other symptoms and signs involving the nervous system: Secondary | ICD-10-CM | POA: Diagnosis not present

## 2014-10-11 NOTE — Therapy (Signed)
[place] [facility] [address], [place], [address] Phone: [phone]   Fax:  [phone]  Physical Therapy Treatment  Patient Details  Name: Kara Rogers MRN: [id number] Date of Birth: 1931-08-14 Referring Provider:  Ethelda Chick, MD  Encounter Date: 10/11/2014      PT End of Session - 10/11/14 1053    Visit Number 5   Number of Visits 8   Date for PT Re-Evaluation 2014/11/10   Authorization Type g codes   PT Start Time 1015   PT Stop Time 1100   PT Time Calculation (min) 45 min   Activity Tolerance Patient tolerated treatment well      Past Medical History  Diagnosis Date  . Essential hypertension, benign   . Pure hypercholesterolemia   . Type II or unspecified type diabetes mellitus without mention of complication, not stated as uncontrolled   . Osteoporosis   . Leg cramps   . Unspecified disorder of skin and subcutaneous tissue   . Unspecified hypothyroidism   . Unspecified vitamin D deficiency   . Personal history of colonic polyps   . Hypercalcemia   . Chicken pox   . Measles   . Mumps     Past Surgical History  Procedure Laterality Date  . Neck surgery      L neck cyst resection  . Thyroid cyst excision    . Hernia repair  1981  . Cataract surgery  02/2011    Left; Right 2009  . Breast ductal resection  02/2010    Left-[place]. Resection of thickened breast duct. Symptoms: L breast drainage hemocult +  . Abdominal hysterectomy  1980    R Ovary intact.  DUB.  [facility] Kitchen Cholecystectomy      There were no vitals filed for this visit.  Visit Diagnosis:  Balance disorder  Weakness generalized      Subjective Assessment - 10/11/14 1012    Subjective Pt reports she was fatigued following previous session, she had to rest for several hours but then was able to participate with church activities, denies falls.   Currently in Pain? No/denies            Objective: Balancemaster wt  shift modified to single circle, forward/back 2x2 min, side to side 2x2 min. Most difficulty with shifting back, pt reported she felt like she was going to fall, able to perform with extensive cuing.  Leg on 2nd step hip rotation with focus on knee maintaining neutral position so hip opens and closes rather than knee valgus/varus. Able to perform on R, unable to perform on L so manually blocked knee to facilitate performance.  Step taps 3x1 min each side.  Step ups on 6" step 2x15. Cuing when performing on R to minimize spinal compensation.  Sit<>stand attempted with ball squeeze, this incr. Pt pain so performed on elevated surface, 3x15.  Ball squeeze with 3 sec. Hold 3x10.  amb in clinic x400'. With this after 200' pt began to favor R/decr. Stance time on R and became less stable requiring incr. Guarding from PT.  Nu-step (no charge) x5 min L3.  Pt required education and cuing for safety and appropriate performance of exercises.                     PT Education - 10/11/14 1034    Education provided Yes   Education Details educated pt on ball squeeze with sit<>stand.   Person(s) Educated Patient  Methods Explanation   Comprehension Verbalized understanding             PT Long Term Goals - 09/29/14 1434    PT LONG TERM GOAL #1   Title Pt will demonstrate increase in by at least 164' in order to demonstrate improved endurance by 10/31/14   Baseline 09/26/14: 1000'   Status New   PT LONG TERM GOAL #2   Title Pt will improve BERG by >3 points in order to improve balance and decrease risk of falls by 10/31/14   Baseline 09/26/14: 50/56   Status New   PT LONG TERM GOAL #3   Title Pt will decrease 5TSTS to below 15 seconds in order to demonstrate improved LE power and decrease risk for falls by 10/31/14   Baseline 09/26/14: 19.7 seconds   Status New   PT LONG TERM GOAL #4   Title Pt will be independent with HEP in order to improve strength and decrease risk of  falls by 10/31/14   Status New   PT LONG TERM GOAL #5   Title Pt will demonstrate improvement on ABC to >67% to improve balance confidence and decrease fear of falling by 10/31/14   Baseline 09/29/14: 48.75%   Status New               Plan - 10/11/14 1054    Clinical Impression Statement pt demonstrated impairment in gait which worsened with fatigue of decr. R stance time and incr. lateral shift generally to the L with change from mod I for additional time to SBA. Pt unable to correct with with cuing so will look to address this in future sessions. Pt continues to be lacking in control, strength, and balance.   Pt will benefit from skilled therapeutic intervention in order to improve on the following deficits Abnormal gait;Decreased balance;Decreased strength   Rehab Potential Good   Clinical Impairments Affecting Rehab Potential Positive: motivation, Negative: slowed cognition, age   PT Frequency 2x / week   PT Duration 4 weeks   PT Treatment/Interventions ADLs/Self Care Home Management;Aquatic Therapy;DME Instruction;Gait training;Stair training;Functional mobility training;Therapeutic activities;Therapeutic exercise;Balance training;Neuromuscular re-education;Cognitive remediation;Patient/family education;Manual techniques        Problem List Patient Active Problem List   Diagnosis Date Noted  . Acute delirium 06/23/2014  . Hallucinations, visual 06/23/2014  . UTI (lower urinary tract infection) 06/23/2014  . Unspecified hypothyroidism 02/21/2012  . Routine general medical examination at a health care facility 02/21/2012  . Routine gynecological examination 02/21/2012  . Urinary incontinence, urge 02/21/2012  . Type II or unspecified type diabetes mellitus without mention of complication, not stated as uncontrolled 11/18/2011  . Pure hypercholesterolemia 11/18/2011  . Essential hypertension, benign 11/18/2011  . Osteopenia 11/18/2011    Ryn Peine 10/11/2014, 10:57  AM  [place] [facility] SPORTS MEDICINE [address], [place], [address] Phone: [phone]   Fax:  [phone]

## 2014-10-13 ENCOUNTER — Ambulatory Visit: Payer: Medicare Other | Admitting: Physical Therapy

## 2014-10-13 DIAGNOSIS — R2689 Other abnormalities of gait and mobility: Secondary | ICD-10-CM

## 2014-10-13 DIAGNOSIS — R29818 Other symptoms and signs involving the nervous system: Secondary | ICD-10-CM | POA: Diagnosis not present

## 2014-10-13 DIAGNOSIS — R531 Weakness: Secondary | ICD-10-CM

## 2014-10-13 NOTE — Therapy (Signed)
[place] [facility] [address], [place], [address] Phone: [phone]   Fax:  [phone]  Physical Therapy Treatment  Patient Details  Name: Kara Rogers MRN: [id number] Date of Birth: 01/31/1932 Referring Provider:  Ethelda Chick, MD  Encounter Date: 10/13/2014      PT End of Session - 10/13/14 0913    Visit Number 6   Number of Visits 8   Date for PT Re-Evaluation November 15, 2014   Authorization Type g codes   PT Start Time 0836   PT Stop Time 0915   PT Time Calculation (min) 39 min   Equipment Utilized During Treatment Gait belt   Activity Tolerance Patient tolerated treatment well      Past Medical History  Diagnosis Date  . Essential hypertension, benign   . Pure hypercholesterolemia   . Type II or unspecified type diabetes mellitus without mention of complication, not stated as uncontrolled   . Osteoporosis   . Leg cramps   . Unspecified disorder of skin and subcutaneous tissue   . Unspecified hypothyroidism   . Unspecified vitamin D deficiency   . Personal history of colonic polyps   . Hypercalcemia   . Chicken pox   . Measles   . Mumps     Past Surgical History  Procedure Laterality Date  . Neck surgery      L neck cyst resection  . Thyroid cyst excision    . Hernia repair  1981  . Cataract surgery  02/2011    Left; Right 2009  . Breast ductal resection  02/2010    Left-[place]. Resection of thickened breast duct. Symptoms: L breast drainage hemocult +  . Abdominal hysterectomy  1980    R Ovary intact.  DUB.  Marland Kitchen Cholecystectomy      There were no vitals filed for this visit.  Visit Diagnosis:  Weakness generalized  Balance disorder      Subjective Assessment - 10/13/14 0840    Subjective Pt reports she is walking every day approximately 20 min. has noticed she is having an easier time getting into and out of the car, is having to use her hands to help lift her legs out of the  car.   Currently in Pain? No/denies       Objective: Narrow stance x1 min with cuing to maintain neutral knee bend.  Tandem stance 2x1 min on each side with same cue - pt hyperextends knees otherwise most likely due to muscle weakness.  30' ramp 1/12 angle 3x10 amb - noted LOB walking up but pt able to correct, unable to maintain awareness of surroundings even with cuing.  Blue airex beam 10x side stepping with cuing for short distance to feel activation of glutes.  Toe taps in seated to address limitation in seated hip flexion, 3x1 min.  Following this pt was exremely fatigued and unable to get into nustep without using UE for assistance.  Nu-step L3 x5 min (no charge).                          PT Education - 10/13/14 0912    Education provided Yes   Education Details PT educated pt on continuing with walking/walking options when visiting her mother next week to avoid dropoff in performance with break from PT.   Person(s) Educated Patient   Methods Explanation             PT Long  Term Goals - 09/29/14 1434    PT LONG TERM GOAL #1   Title Pt will demonstrate increase in by at least 164' in order to demonstrate improved endurance by 10/31/14   Baseline 09/26/14: 1000'   Status New   PT LONG TERM GOAL #2   Title Pt will improve BERG by >3 points in order to improve balance and decrease risk of falls by 10/31/14   Baseline 09/26/14: 50/56   Status New   PT LONG TERM GOAL #3   Title Pt will decrease 5TSTS to below 15 seconds in order to demonstrate improved LE power and decrease risk for falls by 10/31/14   Baseline 09/26/14: 19.7 seconds   Status New   PT LONG TERM GOAL #4   Title Pt will be independent with HEP in order to improve strength and decrease risk of falls by 10/31/14   Status New   PT LONG TERM GOAL #5   Title Pt will demonstrate improvement on ABC to >67% to improve balance confidence and decrease fear of falling by 10/31/14   Baseline  09/29/14: 48.75%   Status New               Problem List Patient Active Problem List   Diagnosis Date Noted  . Acute delirium 06/23/2014  . Hallucinations, visual 06/23/2014  . UTI (lower urinary tract infection) 06/23/2014  . Unspecified hypothyroidism 02/21/2012  . Routine general medical examination at a health care facility 02/21/2012  . Routine gynecological examination 02/21/2012  . Urinary incontinence, urge 02/21/2012  . Type II or unspecified type diabetes mellitus without mention of complication, not stated as uncontrolled 11/18/2011  . Pure hypercholesterolemia 11/18/2011  . Essential hypertension, benign 11/18/2011  . Osteopenia 11/18/2011    Layal Javid 10/13/2014, 9:15 AM  [facility] [address] [place], [address] Phone: [phone]   Fax:  [phone]

## 2014-10-17 ENCOUNTER — Ambulatory Visit: Payer: Medicare Other | Admitting: Physical Therapy

## 2014-10-17 DIAGNOSIS — R29818 Other symptoms and signs involving the nervous system: Secondary | ICD-10-CM | POA: Diagnosis not present

## 2014-10-17 DIAGNOSIS — R531 Weakness: Secondary | ICD-10-CM

## 2014-10-17 DIAGNOSIS — R2689 Other abnormalities of gait and mobility: Secondary | ICD-10-CM

## 2014-10-17 NOTE — Therapy (Signed)
[facility] [address], [place], [address] Phone: [phone]   Fax:  [phone]  Physical Therapy Treatment  Patient Details  Name: Kara Rogers MRN: [id number] Date of Birth: 1931-12-22 Referring Provider:  Wardell Honour, MD  Encounter Date: 10/17/2014      PT End of Session - 10/17/14 1300    Visit Number 7   Number of Visits 8   Date for PT Re-Evaluation 11/27/14   Authorization Type g codes   PT Start Time 1250   PT Stop Time 1330   PT Time Calculation (min) 40 min   Activity Tolerance Patient tolerated treatment well      Past Medical History  Diagnosis Date  . Essential hypertension, benign   . Pure hypercholesterolemia   . Type II or unspecified type diabetes mellitus without mention of complication, not stated as uncontrolled   . Osteoporosis   . Leg cramps   . Unspecified disorder of skin and subcutaneous tissue   . Unspecified hypothyroidism   . Unspecified vitamin D deficiency   . Personal history of colonic polyps   . Hypercalcemia   . Chicken pox   . Measles   . Mumps     Past Surgical History  Procedure Laterality Date  . Neck surgery      L neck cyst resection  . Thyroid cyst excision    . Hernia repair  1981  . Cataract surgery  02/2011    Left; Right 2009  . Breast ductal resection  02/2010    Left-[facility]. Resection of thickened breast duct. Symptoms: L breast drainage hemocult +  . Abdominal hysterectomy  1980    R Ovary intact.  DUB.  Marland Kitchen Cholecystectomy      There were no vitals filed for this visit.  Visit Diagnosis:  Weakness generalized  Balance disorder      Subjective Assessment - 10/17/14 1252    Subjective Pt reports she avoided walking on uneven surfaces over the weekend when she was participating in church activities at a park. No report of LOB or near falls.   Currently in Pain? No/denies              objective: 6MWT. BERG. Tandem stance practice SBA and education for safe performance including keeping chair behind and holding onto sink at home for performance. Single leg practice CGA and same education for home performance.  With gait, instructed pt on strategies to handle distraction as this appears to be a primary gait limiter. Pt becomes unstable when asked to turn head side to side so PT instructed pt to add head turns as tolerated with balance improving. Overall balance appears to be very strong, 53/56 BERG but pt expressed a lack of confidence in her gait.                    PT Education - 10/17/14 1259    Education provided Yes   Education Details educated pt on safety related to hills for her visit to her mother.   Person(s) Educated Patient   Methods Explanation   Comprehension Verbalized understanding             PT Long Term Goals - 10/17/14 1338    PT LONG TERM GOAL #1   Title Pt will demonstrate increase in 6MWT by at least 164' in order to demonstrate improved endurance by 27-Nov-2014   Time 6   Period Weeks   Status  Not Met   PT LONG TERM GOAL #2   Title Pt will improve BERG by >3 points in order to improve balance and decrease risk of falls by 10/31/14   Time 6   Period Weeks   Status Achieved   PT LONG TERM GOAL #3   Title Pt will decrease 5TSTS to below 15 seconds in order to demonstrate improved LE power and decrease risk for falls by 10/31/14   Status Deferred   PT LONG TERM GOAL #4   Title Pt will be independent with HEP in order to improve strength and decrease risk of falls by 10/31/14   Time 6   Period Weeks   Status Achieved               Plan - 10/17/14 1336    Clinical Impression Statement Pt issued balance exercises to practice while she is away from PT. currently pt has made improvement in walking, overall balance. However pt feels her primary problem at this time is fear of falling so pt will continue  for a few additional sessions to address perturbations and catching balance after a LOB to address this and improve confidence/reduce risk of falls.   Pt will benefit from skilled therapeutic intervention in order to improve on the following deficits Abnormal gait;Decreased balance;Decreased strength   Rehab Potential Good   Clinical Impairments Affecting Rehab Potential Positive: motivation, Negative: slowed cognition, age   PT Frequency 2x / week   PT Duration 4 weeks        Problem List Patient Active Problem List   Diagnosis Date Noted  . Acute delirium 06/23/2014  . Hallucinations, visual 06/23/2014  . UTI (lower urinary tract infection) 06/23/2014  . Unspecified hypothyroidism 02/21/2012  . Routine general medical examination at a health care facility 02/21/2012  . Routine gynecological examination 02/21/2012  . Urinary incontinence, urge 02/21/2012  . Type II or unspecified type diabetes mellitus without mention of complication, not stated as uncontrolled 11/18/2011  . Pure hypercholesterolemia 11/18/2011  . Essential hypertension, benign 11/18/2011  . Osteopenia 11/18/2011    Fisher,Benjamin 10/17/2014, 1:44 PM  [facility] [address], [place], [address] Phone: [phone]   Fax:  [phone]

## 2014-10-19 ENCOUNTER — Telehealth: Payer: Self-pay

## 2014-10-19 NOTE — Telephone Encounter (Signed)
   [facility] sent req to clarify if pt is supposed to be taking both the celexa and zoloft? Dr Katrinka Blazing, I see at last OV in July, the celexa was on med list at beginning of OV and you ordered zoloft at that visit, but don't see any notes in "Plan". Both meds are on list at end of OV on AVS. Please review and advise.

## 2014-10-19 NOTE — Telephone Encounter (Signed)
Pt to stop Celexa and START Zoloft.  Thanks!

## 2014-10-20 NOTE — Telephone Encounter (Signed)
Faxed clarification back to [facility]

## 2014-10-30 MED ORDER — OXYBUTYNIN CHLORIDE 5 MG PO TABS: 5.0000 mg | ORAL_TABLET | Freq: Three times a day (TID) | ORAL | Status: DC

## 2014-10-30 MED ORDER — AMLODIPINE BESYLATE 10 MG PO TABS: 10.0000 mg | ORAL_TABLET | Freq: Every day | ORAL | Status: DC

## 2014-10-30 MED ORDER — SIMVASTATIN 20 MG PO TABS: 20.0000 mg | ORAL_TABLET | Freq: Every evening | ORAL | Status: DC

## 2014-10-30 MED ORDER — METOPROLOL SUCCINATE ER 50 MG PO TB24: ORAL_TABLET | ORAL | Status: DC

## 2014-10-30 MED ORDER — METFORMIN HCL 500 MG PO TABS: 500.0000 mg | ORAL_TABLET | Freq: Two times a day (BID) | ORAL | Status: DC

## 2014-10-30 MED ORDER — LOSARTAN POTASSIUM-HCTZ 100-25 MG PO TABS: 1.0000 | ORAL_TABLET | Freq: Every day | ORAL | Status: DC

## 2014-10-30 MED ORDER — ALENDRONATE SODIUM 70 MG PO TABS
70.0000 mg | ORAL_TABLET | ORAL | Status: DC
Start: 2014-10-30 — End: 2015-06-20

## 2014-10-30 MED ORDER — LEVOTHYROXINE SODIUM 50 MCG PO TABS: 50.0000 ug | ORAL_TABLET | Freq: Every day | ORAL | Status: DC

## 2014-11-01 ENCOUNTER — Encounter: Payer: Medicare Other | Admitting: Physical Therapy

## 2014-11-10 ENCOUNTER — Other Ambulatory Visit: Payer: Self-pay

## 2014-11-10 NOTE — Telephone Encounter (Signed)
Per patient she tried having her medication "Sertraline" filled with [facility] and was told our office needed to call. Patient is requesting our office to contact [facility] at [phone] and patients call back number is [phone]

## 2014-11-11 NOTE — Telephone Encounter (Signed)
Called [facility] and they stated a Rx was sent to pt in July and she has refills. I am not sure what the issue is.

## 2014-11-14 NOTE — Telephone Encounter (Signed)
I called [facility] and they stated the reason for the letter is just stating that they received her prescription for the metformin and that they just won't be able to fill it till 12/13/2014 because it would be too early. I spoke to the patient and explained this to her and she understood.

## 2014-11-14 NOTE — Telephone Encounter (Signed)
I spoke with Pt and she stated that she received a letter from [facility] and the problem is with her Metformin prescription. She says it states that they are unable to fill it either because there is a change in her instructions on how she is suppose to take it. The phone number on the forum is [phone]. If I can get through to [facility] today I will to see exactly what information they need.

## 2014-12-14 ENCOUNTER — Telehealth: Payer: Self-pay

## 2014-12-14 NOTE — Telephone Encounter (Signed)
Stanton KidneyDebra would like Dr. Katrinka BlazingSmith to go to her- in basket, pull up patient and go to the coding query. There is an issue that needs to be cleared up with coding. Please advise if need be with  Stanton KidneyDebra at her [phone]

## 2014-12-15 NOTE — Telephone Encounter (Signed)
There is a coding query, can you look at this ? There is a number to call Stanton KidneyDebra in message below

## 2014-12-21 ENCOUNTER — Ambulatory Visit: Payer: Medicare Other | Admitting: Family Medicine

## 2014-12-23 ENCOUNTER — Ambulatory Visit: Payer: Medicare Other | Admitting: Family Medicine

## 2014-12-30 ENCOUNTER — Other Ambulatory Visit: Payer: Self-pay | Admitting: Family Medicine

## 2015-01-30 ENCOUNTER — Ambulatory Visit: Payer: Medicare Other | Admitting: Family Medicine

## 2015-02-24 ENCOUNTER — Other Ambulatory Visit: Payer: Self-pay

## 2015-02-24 MED ORDER — OMEPRAZOLE 20 MG PO CPDR: DELAYED_RELEASE_CAPSULE | ORAL | Status: DC

## 2015-03-06 ENCOUNTER — Ambulatory Visit (INDEPENDENT_AMBULATORY_CARE_PROVIDER_SITE_OTHER): Payer: Medicare Other | Admitting: Family Medicine

## 2015-03-06 ENCOUNTER — Encounter: Payer: Self-pay | Admitting: Family Medicine

## 2015-03-06 ENCOUNTER — Ambulatory Visit: Payer: Medicare Other | Admitting: Family Medicine

## 2015-03-06 VITALS — BP 118/60 | HR 70 | Temp 98.2°F | Resp 16 | Ht 62.5 in | Wt 186.0 lb

## 2015-03-06 DIAGNOSIS — E119 Type 2 diabetes mellitus without complications: Secondary | ICD-10-CM | POA: Diagnosis not present

## 2015-03-06 DIAGNOSIS — E890 Postprocedural hypothyroidism: Secondary | ICD-10-CM

## 2015-03-06 DIAGNOSIS — E785 Hyperlipidemia, unspecified: Secondary | ICD-10-CM

## 2015-03-06 DIAGNOSIS — F329 Major depressive disorder, single episode, unspecified: Secondary | ICD-10-CM | POA: Diagnosis not present

## 2015-03-06 DIAGNOSIS — R413 Other amnesia: Secondary | ICD-10-CM | POA: Diagnosis not present

## 2015-03-06 DIAGNOSIS — I10 Essential (primary) hypertension: Secondary | ICD-10-CM | POA: Diagnosis not present

## 2015-03-06 DIAGNOSIS — F32A Depression, unspecified: Secondary | ICD-10-CM

## 2015-03-06 LAB — CBC WITH DIFFERENTIAL/PLATELET
Basophils Absolute: 0 10*3/uL (ref 0.0–0.1)
Basophils Relative: 0 % (ref 0–1)
Eosinophils Absolute: 0 10*3/uL (ref 0.0–0.7)
Eosinophils Relative: 0 % (ref 0–5)
HCT: 40.1 % (ref 36.0–46.0)
Hemoglobin: 13.6 g/dL (ref 12.0–15.0)
Lymphocytes Relative: 24 % (ref 12–46)
Lymphs Abs: 2.1 10*3/uL (ref 0.7–4.0)
MCH: 28.9 pg (ref 26.0–34.0)
MCHC: 33.9 g/dL (ref 30.0–36.0)
MCV: 85.3 fL (ref 78.0–100.0)
MONOS PCT: 7 % (ref 3–12)
MPV: 9.7 fL (ref 8.6–12.4)
Monocytes Absolute: 0.6 10*3/uL (ref 0.1–1.0)
NEUTROS ABS: 6.1 10*3/uL (ref 1.7–7.7)
NEUTROS PCT: 69 % (ref 43–77)
Platelets: 306 10*3/uL (ref 150–400)
RBC: 4.7 MIL/uL (ref 3.87–5.11)
RDW: 12.6 % (ref 11.5–15.5)
WBC: 8.9 10*3/uL (ref 4.0–10.5)

## 2015-03-06 LAB — COMPREHENSIVE METABOLIC PANEL
ALBUMIN: 4.3 g/dL (ref 3.6–5.1)
ALT: 18 U/L (ref 6–29)
AST: 15 U/L (ref 10–35)
Alkaline Phosphatase: 49 U/L (ref 33–130)
BUN: 25 mg/dL (ref 7–25)
CHLORIDE: 97 mmol/L — AB (ref 98–110)
CO2: 26 mmol/L (ref 20–31)
Calcium: 9.5 mg/dL (ref 8.6–10.4)
Creat: 0.85 mg/dL (ref 0.60–0.88)
Glucose, Bld: 119 mg/dL — ABNORMAL HIGH (ref 65–99)
Potassium: 4.1 mmol/L (ref 3.5–5.3)
Sodium: 135 mmol/L (ref 135–146)
Total Bilirubin: 1 mg/dL (ref 0.2–1.2)
Total Protein: 6.8 g/dL (ref 6.1–8.1)

## 2015-03-06 LAB — HEMOGLOBIN A1C
HEMOGLOBIN A1C: 6.1 % — AB (ref <5.7)
MEAN PLASMA GLUCOSE: 128 mg/dL — AB (ref <117)

## 2015-03-06 LAB — POCT URINALYSIS DIP (MANUAL ENTRY)
Bilirubin, UA: NEGATIVE
Glucose, UA: NEGATIVE
Ketones, POC UA: NEGATIVE
NITRITE UA: POSITIVE — AB
Protein Ur, POC: NEGATIVE
SPEC GRAV UA: 1.015
UROBILINOGEN UA: 0.2
pH, UA: 6

## 2015-03-06 LAB — POC MICROSCOPIC URINALYSIS (UMFC): Mucus: ABSENT

## 2015-03-06 LAB — LIPID PANEL
Cholesterol: 137 mg/dL (ref 125–200)
HDL: 59 mg/dL (ref >=46)
LDL Cholesterol: 59 mg/dL (ref <130)
Total CHOL/HDL Ratio: 2.3 Ratio (ref <=5.0)
Triglycerides: 97 mg/dL (ref <150)
VLDL: 19 mg/dL (ref <30)

## 2015-03-06 LAB — T4, FREE: Free T4: 1.55 ng/dL (ref 0.80–1.80)

## 2015-03-06 LAB — TSH: TSH: 0.735 u[IU]/mL (ref 0.350–4.500)

## 2015-03-06 NOTE — Progress Notes (Signed)
Subjective:    Patient ID: Kara Rogers, female    DOB: 1931/05/27, 82 y.o.   MRN: [id number]  03/06/2015  Diabetes; Hyperlipidemia; Hypertension; and Hypothyroidism   HPI This 82 y.o. female presents with daughter who lives in [place], [place] for six month follow-up:   1. DMII: Patient reports good compliance with medication, good tolerance to medication, and good symptom control.  Sugars running 90-130s fasting.    2.  HTN: Patient reports good compliance with medication, good tolerance to medication, and good symptom control.  Home BPs running WNL.    3.  Hyperlipidemia: Patient reports good compliance with medication, good tolerance to medication, and good symptom control.    4.  Hypothyroidism: Patient reports good compliance with medication, good tolerance to medication, and good symptom control.    5.  Memory loss:   Continues to be an ongoing issue and daughter expressing sincere concern with patient living alone.  Pt is very adamant that she desires to live alone and is doing fine.   Urine culture + at visit in 09/2014.  Treating the UTI doesn't seem to help memory.  No hallucinations with onset of UTI in July 2017.    Short term memory is poor.  Will forget what she is talking about.  Got lost on the way to office three months ago.  Pt couldn't remember how to get to office today but it came to patient eventually.  Forgot medication this week; got confused this week because color of generic medication changed.  Currently living alone; daughter is concerned about patient living alone.  Daughter-in-law lives 25 minutes away from patient.  Referred to physical therapy in July 2017; went to physical therapy x 6 sessions.  Daughters are wanting patient to move in with daughters jointly.  Pt does not desire this.  Pt not open to moving in with daughters.  Daughter second in [place].  Only driving in [place]; to grocery store; to church; car inspection.  No burning anything on the stove.   Did attend mother's 100th birthday party; then spent a week with sister; then spent five weeks with daughter in [place]  [place].  Then went home for two weeks and then other daughter came and got her; stayed with daughter for two weeks.  With daughter for three months for fall.  Rents current home.  No close support in town.  Daughter in law working full time; son works full time.   Flu vaccine 7 weeks ago.  Deceased husband did all the driving prior to his death.    Falls: no recurrent fall; walking really helped; not able to walk recently; walks in driveway to mailbox. Physical therapy did seem to help with strength last summer.  Depression: improved since last visit yet the holidays are difficult for patient; she really misses her husband.     Review of Systems  Constitutional: Negative for fever, chills, diaphoresis and fatigue.  Eyes: Negative for visual disturbance.  Respiratory: Negative for cough and shortness of breath.   Cardiovascular: Negative for chest pain, palpitations and leg swelling.  Gastrointestinal: Negative for nausea, vomiting, abdominal pain, diarrhea and constipation.  Endocrine: Negative for cold intolerance, heat intolerance, polydipsia, polyphagia and polyuria.  Skin: Negative for color change and wound.  Neurological: Negative for dizziness, tremors, seizures, syncope, facial asymmetry, speech difficulty, weakness, light-headedness, numbness and headaches.  Psychiatric/Behavioral: Positive for dysphoric mood. Negative for suicidal ideas, hallucinations, sleep disturbance and self-injury. The patient is not nervous/anxious.  Past Medical History  Diagnosis Date  . Essential hypertension, benign   . Pure hypercholesterolemia   . Type II or unspecified type diabetes mellitus without mention of complication, not stated as uncontrolled   . Osteoporosis   . Leg cramps   . Unspecified disorder of skin and subcutaneous tissue   . Unspecified hypothyroidism   .  Unspecified vitamin D deficiency   . Personal history of colonic polyps   . Hypercalcemia   . Chicken pox   . Measles   . Mumps    Past Surgical History  Procedure Laterality Date  . Neck surgery      L neck cyst resection  . Thyroid cyst excision    . Hernia repair  1981  . Cataract surgery  02/2011    Left; Right 2009  . Breast ductal resection  02/2010    Left-[facility]. Resection of thickened breast duct. Symptoms: L breast drainage hemocult +  . Abdominal hysterectomy  1980    R Ovary intact.  DUB.  Marland Kitchen Cholecystectomy     No Known Allergies Current Outpatient Prescriptions  Medication Sig Dispense Refill  . alendronate (FOSAMAX) 70 MG tablet Take 1 tablet (70 mg total) by mouth every 7 (seven) days. Take with a full glass of water on an empty stomach. 12 tablet 3  . amLODipine (NORVASC) 10 MG tablet Take 1 tablet (10 mg total) by mouth daily. 90 tablet 3  . Ascorbic Acid (VITAMIN C) 1000 MG tablet Take 1,000 mg by mouth daily.    Marland Kitchen aspirin 81 MG tablet Take 81 mg by mouth daily.    . cholecalciferol (VITAMIN D) 1000 UNITS tablet Take 1,000 Units by mouth daily.    Marland Kitchen glucose blood test strip Test blood sugar daily. Dx code: E11.9 100 each 3  . Lancets MISC Test blood sugar daily. Dx code: 250.00 100 each 3  . levothyroxine (SYNTHROID, LEVOTHROID) 50 MCG tablet Take 1 tablet (50 mcg total) by mouth daily. 90 tablet 3  . losartan-hydrochlorothiazide (HYZAAR) 100-25 MG per tablet Take 1 tablet by mouth daily. 90 tablet 3  . metFORMIN (GLUCOPHAGE) 500 MG tablet Take 1 tablet (500 mg total) by mouth 2 (two) times daily with a meal. 180 tablet 3  . metoprolol succinate (TOPROL-XL) 50 MG 24 hr tablet 1 tablet daily 90 tablet 3  . Multiple Vitamin (MULTIVITAMINS PO) Take by mouth.    Marland Kitchen omeprazole (PRILOSEC) 20 MG capsule TAKE 1 CAPSULE BY MOUTH DAILY FOR INDIGESTION. 30 capsule 0  . oxybutynin (DITROPAN) 5 MG tablet Take 1 tablet (5 mg total) by mouth 3 (three) times daily. 270 tablet  3  . PRESCRIPTION MEDICATION T/S Bayer Contour Test Strips 100    . sertraline (ZOLOFT) 50 MG tablet Take 1 tablet (50 mg total) by mouth daily. 90 tablet 3  . simvastatin (ZOCOR) 20 MG tablet Take 1 tablet (20 mg total) by mouth every evening. 90 tablet 3  . citalopram (CELEXA) 20 MG tablet Take 1 tablet (20 mg total) by mouth daily. (Patient not taking: Reported on 03/06/2015) 90 tablet 3   No current facility-administered medications for this visit.   Social History   Social History  . Marital Status: Married    Spouse Name: Pamelia Hoit  . Number of Children: 3  . Years of Education: 12   Occupational History  . retired     2008   Social History Main Topics  . Smoking status: Never Smoker   . Smokeless tobacco: Not on file  .  Alcohol Use: No  . Drug Use: No  . Sexual Activity: Not Currently   Other Topics Concern  . Not on file   Social History Narrative   Marital status: widowed x 2;  Second husband died in 30-Jul-2014; first husband died 07/30/2002 (75 years of marriage). Happily married;no abuse.      [place]      Children:  3 children (10,51,41) son in [place], 2 daughters in [place];  5 grandchildren, 5 gg.        Tobacco: none       Alcohol: none        Exercise: Walking 45-60 minutes. 3 x week; exercise program with church.       Living Will:  Patient DOES have Living Will; +CPR but DNI.      ADLs:   Independent with all ADL's; drives.  No assistant devices.  No falls in 07-29-2012.        Caffeine:  Consumes a minimal amount of caffeine.      Home safety:  Smoke alarms and carbon monoxide detector in the home. +Life Line      Seatbelts:  Always uses seat belts.      Organ donor:  No.       Guns:  Guns in the home: No unsecured guns in the home.    Family History  Problem Relation Age of Onset  . Cancer Father   . Seizures Sister     Epilepsy  . Cancer Sister     breast  . Cirrhosis Brother     deceased  . Osteoporosis Mother   . Arthritis Mother     . Diabetes Sister     insulin dependent       Objective:    BP 118/60 mmHg  Pulse 70  Temp(Src) 98.2 F (36.8 C)  Resp 16  Ht 5' 2.5" (1.588 m)  Wt 186 lb (84.369 kg)  BMI 33.46 kg/m2  SpO2 97% Physical Exam  Constitutional: She is oriented to person, place, and time. She appears well-developed and well-nourished. No distress.  HENT:  Head: Normocephalic and atraumatic.  Right Ear: External ear normal.  Left Ear: External ear normal.  Nose: Nose normal.  Mouth/Throat: Oropharynx is clear and moist.  Eyes: Conjunctivae and EOM are normal. Pupils are equal, round, and reactive to light.  Neck: Normal range of motion. Neck supple. Carotid bruit is not present. No thyromegaly present.  Cardiovascular: Normal rate, regular rhythm, normal heart sounds and intact distal pulses.  Exam reveals no gallop and no friction rub.   No murmur heard. Pulmonary/Chest: Effort normal and breath sounds normal. She has no wheezes. She has no rales.  Abdominal: Soft. Bowel sounds are normal. She exhibits no distension and no mass. There is no tenderness. There is no rebound and no guarding.  Lymphadenopathy:    She has no cervical adenopathy.  Neurological: She is alert and oriented to person, place, and time. No cranial nerve deficit.  Skin: Skin is warm and dry. No rash noted. She is not diaphoretic. No erythema. No pallor.  Psychiatric: She has a normal mood and affect. Her behavior is normal.     MMSE - Mini Mental State Exam 03/06/2015 09/19/2014  Not completed: - Unable to complete  Orientation to time 5 5  Orientation to Place 5 5  Registration 3 3  Attention/ Calculation 1 -  Recall 3 3  Language- name 2 objects 2 2  Language- repeat  1 1  Language- follow 3 step command 3 3  Language- read & follow direction 1 1  Write a sentence 1 1  Copy design 0 -  Total score 25 -       Assessment & Plan:   1. Memory loss   2. Essential hypertension, benign   3. Hyperlipidemia   4.  Type 2 diabetes mellitus without complication, without long-term current use of insulin (HCC)   5. Depression   6. Postoperative hypothyroidism     1. Memory Loss: Persistent and worsening; obtain MRI brain and refer to neurology; s/p B12, RPR, HIV, TSH in past year due to memory loss and all negative other than borderline low B12; started daily MVI.  Recommend patient establishing with new PCP who is located closer to patient's home; provided with two names of physicians in [place] area.  S/p CT head in 06/2014 with acute onset of hallucinations.  2.  HTN: controlled; obtain labs; continue current medications. 3.  Hyperlipidemia: controlled; obtain labs; continue current medications. 4.  DMII: controlled; obtain labs; continue current medications. 5.  Depression: improved but persistent; continue Zoloft 50mg  daily.  Improvement in depression does not seem to be helping memory loss. 6.  Post-operative hypothyroidism: controlled; obtain labs; continue current medications.   Orders Placed This Encounter  Procedures  . Urine culture  . MR Brain W Wo Contrast    Standing Status: Future     Number of Occurrences:      Standing Expiration Date: 05/03/2016    Order Specific Question:  If indicated for the ordered procedure, I authorize the administration of contrast media per Radiology protocol    Answer:  Yes    Order Specific Question:  Reason for Exam (SYMPTOM  OR DIAGNOSIS REQUIRED)    Answer:  memory loss, diabetes, hypertension    Order Specific Question:  Preferred imaging location?    Answer:  [facility]    Order Specific Question:  Does the patient have a pacemaker or implanted devices?    Answer:  No    Order Specific Question:  What is the patient's sedation requirement?    Answer:  No Sedation  . CBC with Differential/Platelet  . Comprehensive metabolic panel    Order Specific Question:  Has the patient fasted?    Answer:  Yes  . Hemoglobin A1c  . Lipid panel    Order  Specific Question:  Has the patient fasted?    Answer:  Yes  . TSH  . T4, free  . Ambulatory referral to Neurology    Referral Priority:  Routine    Referral Type:  Consultation    Referral Reason:  Specialty Services Required    Requested Specialty:  Neurology    Number of Visits Requested:  1  . POCT urinalysis dipstick  . POCT Microscopic Urinalysis ([facility])   No orders of the defined types were placed in this encounter.    Return in about 3 months (around 06/04/2015) for recheck.    Kristi Paulita FujitaMartin Smith, M.D. [facility]  [place] [address] [place], [place]  [phone] phone [phone] fax

## 2015-03-07 ENCOUNTER — Encounter: Payer: Self-pay | Admitting: Family Medicine

## 2015-03-08 LAB — URINE CULTURE

## 2015-03-15 MED ORDER — AMOXICILLIN-POT CLAVULANATE 875-125 MG PO TABS: 1.0000 | ORAL_TABLET | Freq: Two times a day (BID) | ORAL | Status: DC

## 2015-03-15 NOTE — Addendum Note (Signed)
Addended by: Ethelda Chick on: 03/15/2015 08:29 AM   Modules accepted: Orders

## 2015-03-20 ENCOUNTER — Other Ambulatory Visit: Payer: Self-pay | Admitting: Family Medicine

## 2015-04-03 ENCOUNTER — Telehealth: Payer: Self-pay | Admitting: Family Medicine

## 2015-04-05 NOTE — Telephone Encounter (Signed)
Called about appt.

## 2015-04-10 ENCOUNTER — Telehealth: Payer: Self-pay

## 2015-04-10 NOTE — Telephone Encounter (Signed)
Pt has completed the medication for a uti but symtoms have not gone away  Best number [phone]

## 2015-04-13 NOTE — Telephone Encounter (Signed)
Call --- what symptoms is patient having?

## 2015-04-14 NOTE — Telephone Encounter (Signed)
Pt is having a burning sensation mostly.

## 2015-04-16 MED ORDER — AMOXICILLIN-POT CLAVULANATE 875-125 MG PO TABS: 1.0000 | ORAL_TABLET | Freq: Two times a day (BID) | ORAL | Status: DC

## 2015-04-16 NOTE — Telephone Encounter (Signed)
Call --- I sent Augmentin to [facility] for patient. When I saw patient, she was not having any symptoms; when did symptoms start?

## 2015-04-18 NOTE — Telephone Encounter (Signed)
States she is still having burning but not as bad. Otherwise, feeling ok. No fevers. Informed her that augmentin has been sent to pharmacy. If not getting better after this course of abx, return to be seen. Pt understands.  FYI Dr. Katrinka Blazing.

## 2015-05-01 ENCOUNTER — Other Ambulatory Visit: Payer: Self-pay | Admitting: Neurology

## 2015-05-01 DIAGNOSIS — R412 Retrograde amnesia: Secondary | ICD-10-CM

## 2015-05-15 ENCOUNTER — Other Ambulatory Visit: Payer: Self-pay | Admitting: Family Medicine

## 2015-05-19 ENCOUNTER — Ambulatory Visit
Admission: RE | Admit: 2015-05-19 | Discharge: 2015-05-19 | Disposition: A | Discharge status: Home / Self Care | Payer: Medicare Other | Source: Ambulatory Visit | Attending: Neurology | Admitting: Neurology

## 2015-05-19 DIAGNOSIS — I739 Peripheral vascular disease, unspecified: Secondary | ICD-10-CM | POA: Diagnosis not present

## 2015-05-19 DIAGNOSIS — R412 Retrograde amnesia: Secondary | ICD-10-CM | POA: Diagnosis not present

## 2015-06-05 ENCOUNTER — Ambulatory Visit: Payer: Medicare Other | Admitting: Family Medicine

## 2015-06-20 ENCOUNTER — Ambulatory Visit (INDEPENDENT_AMBULATORY_CARE_PROVIDER_SITE_OTHER): Payer: Medicare Other | Admitting: Family Medicine

## 2015-06-20 ENCOUNTER — Encounter: Payer: Self-pay | Admitting: Family Medicine

## 2015-06-20 VITALS — BP 130/58 | HR 58 | Temp 98.2°F | Resp 16 | Ht 63.0 in | Wt 179.8 lb

## 2015-06-20 DIAGNOSIS — F0150 Vascular dementia without behavioral disturbance: Secondary | ICD-10-CM | POA: Diagnosis not present

## 2015-06-20 DIAGNOSIS — E038 Other specified hypothyroidism: Secondary | ICD-10-CM | POA: Diagnosis not present

## 2015-06-20 DIAGNOSIS — E034 Atrophy of thyroid (acquired): Secondary | ICD-10-CM

## 2015-06-20 DIAGNOSIS — N390 Urinary tract infection, site not specified: Secondary | ICD-10-CM

## 2015-06-20 DIAGNOSIS — F32A Depression, unspecified: Secondary | ICD-10-CM

## 2015-06-20 DIAGNOSIS — E538 Deficiency of other specified B group vitamins: Secondary | ICD-10-CM

## 2015-06-20 DIAGNOSIS — E119 Type 2 diabetes mellitus without complications: Secondary | ICD-10-CM | POA: Diagnosis not present

## 2015-06-20 DIAGNOSIS — I10 Essential (primary) hypertension: Secondary | ICD-10-CM | POA: Diagnosis not present

## 2015-06-20 DIAGNOSIS — F329 Major depressive disorder, single episode, unspecified: Secondary | ICD-10-CM

## 2015-06-20 DIAGNOSIS — E7800 Pure hypercholesterolemia, unspecified: Secondary | ICD-10-CM

## 2015-06-20 DIAGNOSIS — F6811 Factitious disorder with predominantly psychological signs and symptoms: Secondary | ICD-10-CM | POA: Diagnosis not present

## 2015-06-20 DIAGNOSIS — R4189 Other symptoms and signs involving cognitive functions and awareness: Secondary | ICD-10-CM

## 2015-06-20 DIAGNOSIS — Z23 Encounter for immunization: Secondary | ICD-10-CM

## 2015-06-20 DIAGNOSIS — M8580 Other specified disorders of bone density and structure, unspecified site: Secondary | ICD-10-CM | POA: Diagnosis not present

## 2015-06-20 LAB — LIPID PANEL
CHOL/HDL RATIO: 2.7 ratio (ref <=5.0)
CHOLESTEROL: 155 mg/dL (ref 125–200)
HDL: 58 mg/dL (ref >=46)
LDL Cholesterol: 73 mg/dL (ref <130)
TRIGLYCERIDES: 119 mg/dL (ref <150)
VLDL: 24 mg/dL (ref <30)

## 2015-06-20 LAB — CBC WITH DIFFERENTIAL/PLATELET
BASOS ABS: 80 {cells}/uL (ref 0–200)
Basophils Relative: 1 %
EOS ABS: 80 {cells}/uL (ref 15–500)
Eosinophils Relative: 1 %
HEMATOCRIT: 41.3 % (ref 35.0–45.0)
HEMOGLOBIN: 13.9 g/dL (ref 11.7–15.5)
LYMPHS ABS: 2480 {cells}/uL (ref 850–3900)
LYMPHS PCT: 31 %
MCH: 28.7 pg (ref 27.0–33.0)
MCHC: 33.7 g/dL (ref 32.0–36.0)
MCV: 85.2 fL (ref 80.0–100.0)
MONO ABS: 480 {cells}/uL (ref 200–950)
MPV: 10.6 fL (ref 7.5–12.5)
Monocytes Relative: 6 %
NEUTROS PCT: 61 %
Neutro Abs: 4880 cells/uL (ref 1500–7800)
Platelets: 276 10*3/uL (ref 140–400)
RBC: 4.85 MIL/uL (ref 3.80–5.10)
RDW: 13.8 % (ref 11.0–15.0)
WBC: 8 10*3/uL (ref 3.8–10.8)

## 2015-06-20 LAB — POCT URINALYSIS DIP (MANUAL ENTRY)
BILIRUBIN UA: NEGATIVE
GLUCOSE UA: NEGATIVE
Ketones, POC UA: NEGATIVE
NITRITE UA: POSITIVE — AB
Protein Ur, POC: NEGATIVE
SPEC GRAV UA: 1.015
UROBILINOGEN UA: 0.2
pH, UA: 7

## 2015-06-20 LAB — COMPREHENSIVE METABOLIC PANEL
ALBUMIN: 4.6 g/dL (ref 3.6–5.1)
ALT: 17 U/L (ref 6–29)
AST: 16 U/L (ref 10–35)
Alkaline Phosphatase: 53 U/L (ref 33–130)
BILIRUBIN TOTAL: 1 mg/dL (ref 0.2–1.2)
BUN: 17 mg/dL (ref 7–25)
CALCIUM: 9.9 mg/dL (ref 8.6–10.4)
CHLORIDE: 97 mmol/L — AB (ref 98–110)
CO2: 27 mmol/L (ref 20–31)
Creat: 0.71 mg/dL (ref 0.60–0.88)
Glucose, Bld: 103 mg/dL — ABNORMAL HIGH (ref 65–99)
POTASSIUM: 4.2 mmol/L (ref 3.5–5.3)
Sodium: 136 mmol/L (ref 135–146)
Total Protein: 7 g/dL (ref 6.1–8.1)

## 2015-06-20 LAB — HEMOGLOBIN A1C
Hgb A1c MFr Bld: 6 % — ABNORMAL HIGH (ref <5.7)
MEAN PLASMA GLUCOSE: 126 mg/dL

## 2015-06-20 LAB — VITAMIN B12

## 2015-06-20 MED ORDER — AMLODIPINE BESYLATE 10 MG PO TABS: 10.0000 mg | ORAL_TABLET | Freq: Every day | ORAL | Status: DC

## 2015-06-20 MED ORDER — LEVOTHYROXINE SODIUM 50 MCG PO TABS: 50.0000 ug | ORAL_TABLET | Freq: Every day | ORAL | Status: DC

## 2015-06-20 MED ORDER — SERTRALINE HCL 50 MG PO TABS: 50.0000 mg | ORAL_TABLET | Freq: Every day | ORAL | Status: DC

## 2015-06-20 MED ORDER — ZOSTER VACCINE LIVE 19400 UNT/0.65ML ~~LOC~~ SOLR: 0.6500 mL | Freq: Once | SUBCUTANEOUS | Status: DC

## 2015-06-20 MED ORDER — METOPROLOL SUCCINATE ER 25 MG PO TB24: ORAL_TABLET | ORAL | Status: DC

## 2015-06-20 MED ORDER — OXYBUTYNIN CHLORIDE 5 MG PO TABS: 5.0000 mg | ORAL_TABLET | Freq: Three times a day (TID) | ORAL | Status: DC

## 2015-06-20 MED ORDER — METFORMIN HCL 500 MG PO TABS: 500.0000 mg | ORAL_TABLET | Freq: Two times a day (BID) | ORAL | Status: DC

## 2015-06-20 MED ORDER — LOSARTAN POTASSIUM-HCTZ 100-25 MG PO TABS: 1.0000 | ORAL_TABLET | Freq: Every day | ORAL | Status: DC

## 2015-06-20 MED ORDER — ALENDRONATE SODIUM 70 MG PO TABS: 70.0000 mg | ORAL_TABLET | ORAL | Status: DC

## 2015-06-20 MED ORDER — SIMVASTATIN 20 MG PO TABS: 20.0000 mg | ORAL_TABLET | Freq: Every evening | ORAL | Status: DC

## 2015-06-20 NOTE — Progress Notes (Signed)
Subjective:    Patient ID: Kara Rogers, female    DOB: 1931/05/07, 82 y.o.   MRN: [id number]  06/20/2015  Follow-up and Medication Refill   HPI This 82 y.o. female presents with neighbor/[name] for three month follow-up:  1.  Vascular dementia:  Memory is better; s/p evaluation by Dr. Sherryll Burger.  S/p MRI.  Started Aricept  and increased to  daily.  Still has problems with recalling word or word finding.  Going from one room to another is better.  MMSE in 04/2015 with Sherryll Burger was 24/30.  Saying task out loud is helpful.  Follow-up with Sherryll Burger not stated; did not make appointment.  Was a little worried with MRI.  Daughters have been discussing patient moving closer to children; +LIfe Alert.  Has good neighbors.  Feels safe.  Neighbor is a phone call a day.  Boneta Lucks sees patient twice per week. No longer drives long distances.  If at night, can go together. Goes to Bible study on Wednesday nights.  Drives to church on Sundays.  Grocery store once per week or more.  Cooks for self.  Chanetta Marshall is available/landlord lives next door.  [place].  Has lived [place] family land for 23 years.  No problems with medications.  Has pill box.  Daughter helps organize medications.  Did have problems with medication.   Does not desire to change PCPs at this time; desires to continue to receive care here.  Has good local support to transport to appointments.   2.  Depression:  neighbor notices that pt is happier.  Emotionally doing better; both husbands died in 01-06-23.  Thinks about deaths all the time.Mother still living at age 8.  One daughter in [place]; one daughter in [place]; son in [place].  Mother is not active.  Mild winter but Spring activity is helpful. Taking Sertraline daily without side effects.    3.  DMII: Patient reports good compliance with medication, good tolerance to medication, and good symptom control.  Checking sugar once daily; sugars are well controlled.   4.  HTN: Patient reports good  compliance with medication, good tolerance to medication, and good symptom control.  Home BPs running 130-150s/70-80s.  6.  Hyperlipidemia: Patient reports good compliance with medication, good tolerance to medication, and good symptom control.     Review of Systems  Constitutional: Negative for fever, chills, diaphoresis and fatigue.  Eyes: Negative for visual disturbance.  Respiratory: Negative for cough and shortness of breath.   Cardiovascular: Positive for leg swelling. Negative for chest pain and palpitations.  Gastrointestinal: Negative for nausea, vomiting, abdominal pain, diarrhea and constipation.  Endocrine: Negative for cold intolerance, heat intolerance, polydipsia, polyphagia and polyuria.  Neurological: Negative for dizziness, tremors, seizures, syncope, facial asymmetry, speech difficulty, weakness, light-headedness, numbness and headaches.  Psychiatric/Behavioral: Negative for suicidal ideas, hallucinations, behavioral problems, confusion, sleep disturbance, self-injury, dysphoric mood and agitation. The patient is not nervous/anxious.     Past Medical History  Diagnosis Date  . Essential hypertension, benign   . Pure hypercholesterolemia   . Type II or unspecified type diabetes mellitus without mention of complication, not stated as uncontrolled   . Osteoporosis   . Leg cramps   . Unspecified disorder of skin and subcutaneous tissue   . Unspecified hypothyroidism   . Unspecified vitamin D deficiency   . Personal history of colonic polyps   . Hypercalcemia   . Chicken pox   . Measles   . Mumps    Past Surgical  History  Procedure Laterality Date  . Neck surgery      L neck cyst resection  . Thyroid cyst excision    . Hernia repair  1981  . Cataract surgery  02/2011    Left; Right 07/14/2007  . Breast ductal resection  02/2010    Left-[place]. Resection of thickened breast duct. Symptoms: L breast drainage hemocult +  . Abdominal hysterectomy  1980    R Ovary  intact.  DUB.  Marland Kitchen Cholecystectomy     No Known Allergies  Social History   Social History  . Marital Status: Married    Spouse Name: Pamelia Hoit  . Number of Children: 3  . Years of Education: 12   Occupational History  . retired     Jul 14, 2006   Social History Main Topics  . Smoking status: Never Smoker   . Smokeless tobacco: Not on file  . Alcohol Use: No  . Drug Use: No  . Sexual Activity: Not Currently   Other Topics Concern  . Not on file   Social History Narrative   Marital status: widowed x 2;  Second husband died in 14-Jul-2014; first husband died Jul 14, 2002 (65 years of marriage).      [place].      Children:  3 children (40,51,41) son in [place], 2 daughters in [place];  5 grandchildren, 5 gg.        Tobacco: none       Alcohol: none        Exercise: Walking to mailbox daily; no formal exercise program in Jul 14, 2015.      Living Will:  Patient DOES have Living Will; +CPR but DNI.      ADLs:   Independent with all ADL's; drives locally only.  No assistant devices.  +LifeLine.        Caffeine:  Consumes a minimal amount of caffeine.      Home safety:  Smoke alarms and carbon monoxide detector in the home. +Life Line      Seatbelts:  Always uses seat belts.      Organ donor:  No.       Guns in the home: No unsecured guns in the home.    Family History  Problem Relation Age of Onset  . Cancer Father   . Seizures Sister     Epilepsy  . Cancer Sister     breast  . Cirrhosis Brother     deceased  . Osteoporosis Mother   . Arthritis Mother   . Diabetes Sister     insulin dependent       Objective:    BP 130/58 mmHg  Pulse 58  Temp(Src) 98.2 F (36.8 C) (Oral)  Resp 16  Ht  (1.6 m)  Wt 179 lb 12.8 oz (81.557 kg)  BMI 31.86 kg/m2  SpO2 94% Physical Exam  Constitutional: She is oriented to person, place, and time. She appears well-developed and well-nourished. No distress.  HENT:  Head: Normocephalic and atraumatic.  Right Ear: External ear  normal.  Left Ear: External ear normal.  Nose: Nose normal.  Mouth/Throat: Oropharynx is clear and moist.  Eyes: Conjunctivae and EOM are normal. Pupils are equal, round, and reactive to light.  Neck: Normal range of motion. Neck supple. Carotid bruit is not present. No thyromegaly present.  Cardiovascular: Normal rate, regular rhythm, normal heart sounds and intact distal pulses.  Exam reveals no gallop and no friction rub.   No murmur heard. 2+ non-pitting  edema BLE to mid-calves.  Pulmonary/Chest: Effort normal and breath sounds normal. She has no wheezes. She has no rales.  Abdominal: Soft. Bowel sounds are normal. She exhibits no distension and no mass. There is no tenderness. There is no rebound and no guarding.  Musculoskeletal: She exhibits edema.  Lymphadenopathy:    She has no cervical adenopathy.  Neurological: She is alert and oriented to person, place, and time. No cranial nerve deficit.  Skin: Skin is warm and dry. No rash noted. She is not diaphoretic. No erythema. No pallor.  Psychiatric: She has a normal mood and affect. Her behavior is normal.        Assessment & Plan:   1. Type 2 diabetes mellitus without complication, without long-term current use of insulin (HCC)   2. Essential hypertension, benign   3. Osteopenia   4. Pure hypercholesterolemia   5. Hypothyroidism due to acquired atrophy of thyroid   6. Vascular dementia, without behavioral disturbance   7. Vitamin B12 deficiency   8. Recurrent UTI   9. Pseudodementia   10. Depression     -stable. -s/p neurology consultation with Sherryll Burger; s/p MRI that showed white matter changes. -tolerating Aricept.   -definitely feel that depression contributing to memory loss; depression appears improved today.  -obtain labs. -refills provided. -decrease Metoprolol ER to 25mg  daily due to bradycardia; may warrant discontinuing at next visit.   -s/p Pneumovax 23.  Rx for Zostavax provided.    Orders Placed This  Encounter  Procedures  . Pneumococcal polysaccharide vaccine 23-valent greater than or equal to 2yo subcutaneous/IM  . CBC with Differential/Platelet  . Comprehensive metabolic panel    Order Specific Question:  Has the patient fasted?    Answer:  Yes  . Hemoglobin A1c  . Lipid panel    Order Specific Question:  Has the patient fasted?    Answer:  Yes  . Vitamin B12  . POCT urinalysis dipstick  . HM Diabetes Foot Exam   Meds ordered this encounter  Medications  . simvastatin (ZOCOR) 20 MG tablet    Sig: Take 1 tablet (20 mg total) by mouth every evening.    Dispense:  90 tablet    Refill:  3  . sertraline (ZOLOFT) 50 MG tablet    Sig: Take 1 tablet (50 mg total) by mouth daily.    Dispense:  90 tablet    Refill:  3  . oxybutynin (DITROPAN) 5 MG tablet    Sig: Take 1 tablet (5 mg total) by mouth 3 (three) times daily.    Dispense:  270 tablet    Refill:  3  . metFORMIN (GLUCOPHAGE) 500 MG tablet    Sig: Take 1 tablet (500 mg total) by mouth 2 (two) times daily with a meal.    Dispense:  180 tablet    Refill:  3  . losartan-hydrochlorothiazide (HYZAAR) 100-25 MG tablet    Sig: Take 1 tablet by mouth daily.    Dispense:  90 tablet    Refill:  3  . levothyroxine (SYNTHROID, LEVOTHROID) 50 MCG tablet    Sig: Take 1 tablet (50 mcg total) by mouth daily.    Dispense:  90 tablet    Refill:  3  . amLODipine (NORVASC) 10 MG tablet    Sig: Take 1 tablet (10 mg total) by mouth daily.    Dispense:  90 tablet    Refill:  3  . alendronate (FOSAMAX) 70 MG tablet    Sig: Take 1 tablet (70  mg total) by mouth every 7 (seven) days. Take with a full glass of water on an empty stomach.    Dispense:  12 tablet    Refill:  3  . metoprolol succinate (TOPROL-XL) 25 MG 24 hr tablet    Sig: 1 tablet daily    Dispense:  90 tablet    Refill:  1  . zoster vaccine live, PF, (ZOSTAVAX) [id number] UNT/0.65ML injection    Sig: Inject 19,400 Units into the skin once.    Dispense:  1 each    Refill:  0   . donepezil (ARICEPT) 10 MG tablet    Sig:     Return in about 4 months (around 10/20/2015) for complete physical examiniation.    Haunani Dickard Paulita FujitaMartin Rhema Boyett, M.D. [facility]  [place] [address] [place], [place]  [id number] [phone] phone [phone]) [phone] fax

## 2015-06-20 NOTE — Patient Instructions (Signed)
     IF you received an x-ray today, you will receive an invoice from [facility]. Please contact [facility] at [phone] with questions or concerns regarding your invoice.   IF you received labwork today, you will receive an invoice from [facility]. Please contact [facility] at [phone] with questions or concerns regarding your invoice.   Our billing staff will not be able to assist you with questions regarding bills from these companies.  You will be contacted with the lab results as soon as they are available. The fastest way to get your results is to activate your My Chart account. Instructions are located on the last page of this paperwork. If you have not heard from us regarding the results in 2 weeks, please contact this office.      

## 2015-07-03 ENCOUNTER — Encounter: Payer: Self-pay | Admitting: Family Medicine

## 2015-07-21 ENCOUNTER — Telehealth: Payer: Self-pay | Admitting: *Deleted

## 2015-07-21 NOTE — Telephone Encounter (Signed)
Olegario MessierKathy from [facility] called about a bone density scan done last year 09/22/2014.  They stated that they have faxed this 3 times to us and we have not responded.  For what I have read is that the need a diagnosis code other than Osteoporosis.  It looks like they may have put the code in wrong.  I looks like it supposed be Osteopenia.  I wrote this into the box on the form.  Now it just needs your signature.  Olegario MessierKathy stated that it must be your signature.  Form is in your box in the Dr lounge.

## 2015-10-12 ENCOUNTER — Other Ambulatory Visit: Payer: Self-pay | Admitting: Family Medicine

## 2015-10-12 DIAGNOSIS — I10 Essential (primary) hypertension: Secondary | ICD-10-CM

## 2015-11-20 ENCOUNTER — Other Ambulatory Visit: Payer: Self-pay | Admitting: Family Medicine

## 2015-11-20 DIAGNOSIS — I10 Essential (primary) hypertension: Secondary | ICD-10-CM

## 2015-12-19 ENCOUNTER — Encounter: Payer: Self-pay | Admitting: Family Medicine

## 2016-01-01 ENCOUNTER — Other Ambulatory Visit: Payer: Self-pay | Admitting: Family Medicine

## 2016-01-01 DIAGNOSIS — I10 Essential (primary) hypertension: Secondary | ICD-10-CM

## 2016-01-02 NOTE — Telephone Encounter (Signed)
Pt has appt sch for 12/6

## 2016-01-12 ENCOUNTER — Telehealth: Payer: Self-pay

## 2016-01-12 NOTE — Telephone Encounter (Signed)
[employer] called and is checking on status of a Pt Query form they faxed to Dr Katrinka BlazingSmith. Evidently more info or notes is needed on OV from 05/11/2014 OV. I don't see this is Dr Michaelle CopasSmith's box or in recent scan box. Dr Katrinka BlazingSmith, do you remember completing this or might you have it with you in paperwork you are working on? Ref # [id number], [employer] ph [phone].

## 2016-01-26 ENCOUNTER — Other Ambulatory Visit: Payer: Self-pay | Admitting: Family Medicine

## 2016-01-26 DIAGNOSIS — I10 Essential (primary) hypertension: Secondary | ICD-10-CM

## 2016-02-07 ENCOUNTER — Other Ambulatory Visit: Payer: Self-pay | Admitting: Family Medicine

## 2016-02-07 ENCOUNTER — Ambulatory Visit (INDEPENDENT_AMBULATORY_CARE_PROVIDER_SITE_OTHER): Payer: Medicare Other | Admitting: Family Medicine

## 2016-02-07 ENCOUNTER — Encounter: Payer: Self-pay | Admitting: Family Medicine

## 2016-02-07 VITALS — BP 130/50 | HR 63 | Temp 97.6°F | Resp 16 | Ht 63.0 in | Wt 184.2 lb

## 2016-02-07 DIAGNOSIS — R0789 Other chest pain: Secondary | ICD-10-CM

## 2016-02-07 DIAGNOSIS — M8589 Other specified disorders of bone density and structure, multiple sites: Secondary | ICD-10-CM | POA: Diagnosis not present

## 2016-02-07 DIAGNOSIS — E7800 Pure hypercholesterolemia, unspecified: Secondary | ICD-10-CM

## 2016-02-07 DIAGNOSIS — E034 Atrophy of thyroid (acquired): Secondary | ICD-10-CM | POA: Diagnosis not present

## 2016-02-07 DIAGNOSIS — Z23 Encounter for immunization: Secondary | ICD-10-CM

## 2016-02-07 DIAGNOSIS — R441 Visual hallucinations: Secondary | ICD-10-CM

## 2016-02-07 DIAGNOSIS — Z0000 Encounter for general adult medical examination without abnormal findings: Secondary | ICD-10-CM | POA: Diagnosis not present

## 2016-02-07 DIAGNOSIS — I10 Essential (primary) hypertension: Secondary | ICD-10-CM

## 2016-02-07 DIAGNOSIS — N3941 Urge incontinence: Secondary | ICD-10-CM | POA: Diagnosis not present

## 2016-02-07 DIAGNOSIS — E119 Type 2 diabetes mellitus without complications: Secondary | ICD-10-CM | POA: Diagnosis not present

## 2016-02-07 DIAGNOSIS — F0150 Vascular dementia without behavioral disturbance: Secondary | ICD-10-CM

## 2016-02-07 LAB — POCT URINALYSIS DIP (MANUAL ENTRY)
BILIRUBIN UA: NEGATIVE
BILIRUBIN UA: NEGATIVE
Blood, UA: NEGATIVE
Glucose, UA: NEGATIVE
Nitrite, UA: POSITIVE — AB
PH UA: 6
Protein Ur, POC: NEGATIVE
Spec Grav, UA: 1.01
Urobilinogen, UA: 0.2

## 2016-02-07 NOTE — Progress Notes (Signed)
Subjective:    Patient ID: Kara RibasBonnie M Mish, female    DOB: 04/18/1931, 82 y.o.   MRN: [id number]  02/07/2016  Annual Exam   HPI This 82 y.o. female presents for Welcome to Medicare Physical and Complete Physical Examination.  Last physical:  09-2014 Bone density: 09-2014 Eye exam:  2017 Dental exam:  None; cancelled  Immunization History  Administered Date(s) Administered  . Influenza Split 11/18/2011  . Influenza,inj,Quad PF,36+ Mos 01/01/2013, 11/03/2013, 01/03/2015, 02/07/2016  . Influenza-Unspecified 11/26/2008, 11/22/2009, 12/19/2010  . Pneumococcal Conjugate-13 07/21/2013  . Pneumococcal Polysaccharide-23 06/20/2015  . Pneumococcal-Unspecified 02/12/2007  . Tdap 12/19/2010   Wt Readings from Last 3 Encounters:  02/07/16 184 lb 3.2 oz (83.6 kg)  06/20/15 179 lb 12.8 oz (81.6 kg)  03/06/15 186 lb (84.4 kg)   BP Readings from Last 3 Encounters:  02/07/16 (!) 130/50  06/20/15 (!) 130/58  03/06/15 118/60    .Balance is off.  HTN: Patient reports good compliance with medication, good tolerance to medication, and good symptom control.    DMII: Patient reports good compliance with medication, good tolerance to medication, and good symptom control.    Hypercholesterolemia:  Patient reports good compliance with medication, good tolerance to medication, and good symptom control.    Dementia: Patient reports good compliance with medication, good tolerance to medication, and good symptom control.     Review of Systems  Constitutional: Negative for activity change, appetite change, chills, diaphoresis, fatigue, fever and unexpected weight change.  HENT: Negative for congestion, dental problem, drooling, ear discharge, ear pain, facial swelling, hearing loss, mouth sores, nosebleeds, postnasal drip, rhinorrhea, sinus pressure, sneezing, sore throat, tinnitus, trouble swallowing and voice change.   Eyes: Negative for photophobia, pain, discharge, redness, itching and visual  disturbance.  Respiratory: Negative for apnea, cough, choking, chest tightness, shortness of breath, wheezing and stridor.   Cardiovascular: Negative for chest pain, palpitations and leg swelling.  Gastrointestinal: Negative for abdominal distention, abdominal pain, anal bleeding, blood in stool, constipation, diarrhea, nausea, rectal pain and vomiting.  Endocrine: Negative for cold intolerance, heat intolerance, polydipsia, polyphagia and polyuria.  Genitourinary: Negative for decreased urine volume, difficulty urinating, dyspareunia, dysuria, enuresis, flank pain, frequency, genital sores, hematuria, menstrual problem, pelvic pain, urgency, vaginal bleeding, vaginal discharge and vaginal pain.       Nocturia x 1.  Urinary leakage stable.  Musculoskeletal: Negative for arthralgias, back pain, gait problem, joint swelling, myalgias, neck pain and neck stiffness.  Skin: Negative for color change, pallor, rash and wound.  Allergic/Immunologic: Negative for environmental allergies, food allergies and immunocompromised state.  Neurological: Negative for dizziness, tremors, seizures, syncope, facial asymmetry, speech difficulty, weakness, light-headedness, numbness and headaches.  Hematological: Negative for adenopathy. Does not bruise/bleed easily.  Psychiatric/Behavioral: Negative for agitation, behavioral problems, confusion, decreased concentration, dysphoric mood, hallucinations, self-injury, sleep disturbance and suicidal ideas. The patient is not nervous/anxious and is not hyperactive.        Bedtime 11:00pm; wakes up 5:00am.    Past Medical History:  Diagnosis Date  . Chicken pox   . Essential hypertension, benign   . Hypercalcemia   . Leg cramps   . Measles   . Mumps   . Osteoporosis   . Personal history of colonic polyps   . Pure hypercholesterolemia   . Type II or unspecified type diabetes mellitus without mention of complication, not stated as uncontrolled   . Unspecified disorder  of skin and subcutaneous tissue   . Unspecified hypothyroidism   . Unspecified vitamin D  deficiency    Past Surgical History:  Procedure Laterality Date  . ABDOMINAL HYSTERECTOMY  1980   R Ovary intact.  DUB.  Marland Kitchen Breast Ductal Resection  02/2010   Left-[place]. Resection of thickened breast duct. Symptoms: L breast drainage hemocult +  . Cataract surgery  02/2011   Left; Right Jul 16, 2007  . CHOLECYSTECTOMY    . HERNIA REPAIR  1981  . NECK SURGERY     L neck cyst resection  . THYROID CYST EXCISION     No Known Allergies Current Outpatient Prescriptions  Medication Sig Dispense Refill  . Ascorbic Acid (VITAMIN C) 1000 MG tablet Take 1,000 mg by mouth daily.    Marland Kitchen aspirin 81 MG tablet Take 81 mg by mouth daily.    . cholecalciferol (VITAMIN D) 1000 UNITS tablet Take 1,000 Units by mouth daily.    Marland Kitchen donepezil (ARICEPT) 10 MG tablet     . glucose blood (ACCU-CHEK AVIVA PLUS) test strip Use to test blood sugar  once daily  Dx: DMII 250.00 100 each 3  . Lancets MISC Test blood sugar daily. Dx code: 250.00 100 each 3  . Multiple Vitamin (MULTIVITAMINS PO) Take by mouth.    Marland Kitchen PRESCRIPTION MEDICATION T/S Bayer Contour Test Strips 100    . alendronate (FOSAMAX) 70 MG tablet TAKE 1 TABLET BY MOUTH  EVERY 7 DAYS WITH A FULL  GLASS OF WATER ON AN EMPTY  STOMACH 12 tablet 3  . amLODipine (NORVASC) 10 MG tablet Take 1 tablet (10 mg total) by mouth daily. 90 tablet 3  . levothyroxine (SYNTHROID, LEVOTHROID) 50 MCG tablet Take 1 tablet (50 mcg total) by mouth daily. 90 tablet 3  . losartan-hydrochlorothiazide (HYZAAR) 100-25 MG tablet Take 1 tablet by mouth daily. 90 tablet 3  . metFORMIN (GLUCOPHAGE) 500 MG tablet TAKE 1 TABLET BY MOUTH TWO  TIMES DAILY WITH A MEAL 180 tablet 3  . metoprolol succinate (TOPROL-XL) 25 MG 24 hr tablet Take 1 tablet (25 mg total) by mouth daily. 90 tablet 3  . omeprazole (PRILOSEC) 20 MG capsule TAKE 1 CAPSULE BY MOUTH  DAILY FOR INDIGESTION 90 capsule 0  . oxybutynin  (DITROPAN) 5 MG tablet Take 1 tablet (5 mg total) by mouth 3 (three) times daily. 270 tablet 3  . sertraline (ZOLOFT) 50 MG tablet Take 1 tablet (50 mg total) by mouth daily. 90 tablet 3  . simvastatin (ZOCOR) 20 MG tablet Take 1 tablet (20 mg total) by mouth every evening. 90 tablet 3   No current facility-administered medications for this visit.    Social History   Social History  . Marital status: Married    Spouse name: Pamelia Hoit  . Number of children: 3  . Years of education: 12   Occupational History  . retired Retired    2006/07/16   Social History Main Topics  . Smoking status: Never Smoker  . Smokeless tobacco: Current User  . Alcohol use No  . Drug use: No  . Sexual activity: Not Currently   Other Topics Concern  . Not on file   Social History Narrative   Marital status: widowed x 2;  Second husband died in 16-Jul-2014; first husband died 07/16/02 (85 years of marriage).      [place].      Children:  3 children (89,51,41) son in [place], 2 daughters in [place];  5 grandchildren, 5 gg.        Tobacco: none  Alcohol: none        Exercise: Walking to mailbox daily; no formal exercise program in 2017.      Living Will:  Patient DOES have Living Will; DNR/DNI.      ADLs:   Independent with all ADL's; drives locally only.  No assistant devices.  +LifeLine.    Uses cane 100%.        Caffeine:  Consumes a minimal amount of caffeine.      Home safety:  Smoke alarms and carbon monoxide detector in the home. +[employer] Line      Seatbelts:  Always uses seat belts.      Organ donor:  No.       Guns in the home: No unsecured guns in the home.    Family History  Problem Relation Age of Onset  . Cancer Father   . Osteoporosis Mother   . Arthritis Mother     wheelchair bound in 2017  . Seizures Sister     Epilepsy  . Cancer Sister     breast  . Cirrhosis Brother     deceased  . Diabetes Sister     insulin dependent       Objective:    BP (!) 130/50 (BP  Location: Left Arm, Patient Position: Sitting, Cuff Size: Small)   Pulse 63   Temp 97.6 F (36.4 C) (Oral)   Resp 16   Ht 5\' 3"  (1.6 m)   Wt 184 lb 3.2 oz (83.6 kg)   SpO2 97%   BMI 32.63 kg/m  Physical Exam  Constitutional: She is oriented to person, place, and time. She appears well-developed and well-nourished. No distress.  HENT:  Head: Normocephalic and atraumatic.  Right Ear: External ear normal.  Left Ear: External ear normal.  Nose: Nose normal.  Mouth/Throat: Oropharynx is clear and moist.  Eyes: Conjunctivae and EOM are normal. Pupils are equal, round, and reactive to light.  Neck: Normal range of motion and full passive range of motion without pain. Neck supple. No JVD present. Carotid bruit is not present. No thyromegaly present.  Cardiovascular: Normal rate, regular rhythm, normal heart sounds and intact distal pulses.  Exam reveals no gallop and no friction rub.   No murmur heard. Pulmonary/Chest: Effort normal and breath sounds normal. She has no wheezes. She has no rales.  Abdominal: Soft. Bowel sounds are normal. She exhibits no distension and no mass. There is no tenderness. There is no rebound and no guarding.  Musculoskeletal:       Right shoulder: Normal.       Left shoulder: Normal.       Cervical back: Normal.  Lymphadenopathy:    She has no cervical adenopathy.  Neurological: She is alert and oriented to person, place, and time. She has normal reflexes. No cranial nerve deficit. She exhibits normal muscle tone. Coordination normal.  Skin: Skin is warm and dry. No rash noted. She is not diaphoretic. No erythema. No pallor.  Psychiatric: She has a normal mood and affect. Her behavior is normal. Judgment and thought content normal.  Nursing note and vitals reviewed.  Results for orders placed or performed in visit on 02/07/16  CBC with Differential/Platelet  Result Value Ref Range   WBC 8.2 3.4 - 10.8 x10E3/uL   RBC 4.62 3.77 - 5.28 x10E6/uL   Hemoglobin  13.3 11.1 - 15.9 g/dL   Hematocrit 16.1 09.6 - 46.6 %   MCV 85 79 - 97 fL   MCH 28.8 26.6 -  33.0 pg   MCHC 33.9 31.5 - 35.7 g/dL   RDW 40.9 81.1 - 91.4 %   Platelets 285 150 - 379 x10E3/uL   Neutrophils 64 Not Estab. %   Lymphs 28 Not Estab. %   Monocytes 7 Not Estab. %   Eos 1 Not Estab. %   Basos 0 Not Estab. %   Neutrophils Absolute 5.2 1.4 - 7.0 x10E3/uL   Lymphocytes Absolute 2.3 0.7 - 3.1 x10E3/uL   Monocytes Absolute 0.6 0.1 - 0.9 x10E3/uL   EOS (ABSOLUTE) 0.1 0.0 - 0.4 x10E3/uL   Basophils Absolute 0.0 0.0 - 0.2 x10E3/uL   Immature Granulocytes 0 Not Estab. %   Immature Grans (Abs) 0.0 0.0 - 0.1 x10E3/uL  Comprehensive metabolic panel  Result Value Ref Range   Glucose 97 65 - 99 mg/dL   BUN 27 8 - 27 mg/dL   Creatinine, Ser 7.82 0.57 - 1.00 mg/dL   GFR calc non Af Amer 70 >59 mL/min/1.73   GFR calc Af Amer 81 >59 mL/min/1.73   BUN/Creatinine Ratio 34 (H) 12 - 28   Sodium 137 134 - 144 mmol/L   Potassium 4.1 3.5 - 5.2 mmol/L   Chloride 94 (L) 96 - 106 mmol/L   CO2 24 18 - 29 mmol/L   Calcium 10.0 8.7 - 10.3 mg/dL   Total Protein 6.8 6.0 - 8.5 g/dL   Albumin 4.4 3.5 - 4.7 g/dL   Globulin, Total 2.4 1.5 - 4.5 g/dL   Albumin/Globulin Ratio 1.8 1.2 - 2.2   Bilirubin Total 0.5 0.0 - 1.2 mg/dL   Alkaline Phosphatase 56 39 - 117 IU/L   AST 14 0 - 40 IU/L   ALT 14 0 - 32 IU/L  Lipid panel  Result Value Ref Range   Cholesterol, Total 155 100 - 199 mg/dL   Triglycerides 956 0 - 149 mg/dL   HDL 57 >21 mg/dL   VLDL Cholesterol Cal 28 5 - 40 mg/dL   LDL Calculated 70 0 - 99 mg/dL   Chol/HDL Ratio 2.7 0.0 - 4.4 ratio units  TSH  Result Value Ref Range   TSH 0.600 0.450 - 4.500 uIU/mL  Hemoglobin A1c  Result Value Ref Range   Hgb A1c MFr Bld 5.7 (H) 4.8 - 5.6 %   Est. average glucose Bld gHb Est-mCnc 117 mg/dL  POCT urinalysis dipstick  Result Value Ref Range   Color, UA yellow yellow   Clarity, UA clear clear   Glucose, UA negative negative   Bilirubin, UA  negative negative   Ketones, POC UA negative negative   Spec Grav, UA 1.010    Blood, UA negative negative   pH, UA 6.0    Protein Ur, POC negative negative   Urobilinogen, UA 0.2    Nitrite, UA Positive (A) Negative   Leukocytes, UA small (1+) (A) Negative   Fall Risk  02/07/2016 06/20/2015 09/19/2014 06/22/2014 11/03/2013  Falls in the past year? No No Yes Yes Yes  Number falls in past yr: - - 2 or more 2 or more -  Injury with Fall? - - No - -   Depression screen [facility] 2/9 02/07/2016 06/20/2015 03/06/2015 09/19/2014 06/22/2014  Decreased Interest 0 0 0 3 0  Down, Depressed, Hopeless 0 0 0 2 1  PHQ - 2 Score 0 0 0 5 1  Altered sleeping - - - 1 -  Tired, decreased energy - - - 3 -  Change in appetite - - - 0 -  Feeling bad  or failure about yourself  - - - 0 -  Trouble concentrating - - - 1 -  Moving slowly or fidgety/restless - - - 3 -  Suicidal thoughts - - - 0 -  PHQ-9 Score - - - 13 -  Difficult doing work/chores - - - Very difficult -   Functional Status Survey: Is the patient deaf or have difficulty hearing?: No Does the patient have difficulty seeing, even when wearing glasses/contacts?: No Does the patient have difficulty concentrating, remembering, or making decisions?: Yes Does the patient have difficulty walking or climbing stairs?: Yes Does the patient have difficulty dressing or bathing?: No Does the patient have difficulty doing errands alone such as visiting a doctor's office or shopping?: No     Assessment & Plan:   1. Medicare annual wellness visit, subsequent   2. Need for prophylactic vaccination and inoculation against influenza   3. Essential hypertension, benign   4. Hypothyroidism due to acquired atrophy of thyroid   5. Type 2 diabetes mellitus without complication, without long-term current use of insulin (HCC)   6. Vascular dementia without behavioral disturbance   7. Pure hypercholesterolemia   8. Urinary incontinence, urge   9. Hallucinations, visual     10. Osteopenia of multiple sites   11. Other chest pain   12. Routine physical examination    -refer to cardiology due to intermittent chest pain; to ED for recurrent pain. -obtain labs. -refills provided. -good support from church friends; recommend increasing exercise to help improve balance.   Orders Placed This Encounter  Procedures  . Flu Vaccine QUAD 36+ mos PF IM (Fluarix & Fluzone Quad PF)  . CBC with Differential/Platelet  . Comprehensive metabolic panel    Order Specific Question:   Has the patient fasted?    Answer:   Yes  . Lipid panel    Order Specific Question:   Has the patient fasted?    Answer:   Yes  . TSH  . Hemoglobin A1c  . Ambulatory referral to Cardiology    Referral Priority:   Routine    Referral Type:   Consultation    Referral Reason:   Specialty Services Required    Referred to Provider:   Iran OuchMuhammad A Arida, MD    Requested Specialty:   Cardiology    Number of Visits Requested:   1  . POCT urinalysis dipstick  . EKG 12-Lead   Meds ordered this encounter  Medications  . alendronate (FOSAMAX) 70 MG tablet    Sig: TAKE 1 TABLET BY MOUTH  EVERY 7 DAYS WITH A FULL  GLASS OF WATER ON AN EMPTY  STOMACH    Dispense:  12 tablet    Refill:  3  . DISCONTD: amLODipine (NORVASC) 10 MG tablet    Sig: Take 1 tablet (10 mg total) by mouth daily.    Dispense:  90 tablet    Refill:  3  . DISCONTD: Lancets MISC    Sig: Test blood sugar daily. Dx code: 250.00    Dispense:  100 each    Refill:  3  . DISCONTD: levothyroxine (SYNTHROID, LEVOTHROID) 50 MCG tablet    Sig: Take 1 tablet (50 mcg total) by mouth daily.    Dispense:  90 tablet    Refill:  3  . DISCONTD: losartan-hydrochlorothiazide (HYZAAR) 100-25 MG tablet    Sig: Take 1 tablet by mouth daily.    Dispense:  90 tablet    Refill:  3  . DISCONTD: metFORMIN (GLUCOPHAGE)  500 MG tablet    Sig: TAKE 1 TABLET BY MOUTH TWO  TIMES DAILY WITH A MEAL    Dispense:  180 tablet    Refill:  3  . DISCONTD:  metoprolol succinate (TOPROL-XL) 25 MG 24 hr tablet    Sig: Take 1 tablet (25 mg total) by mouth daily.    Dispense:  90 tablet    Refill:  3  . DISCONTD: oxybutynin (DITROPAN) 5 MG tablet    Sig: Take 1 tablet (5 mg total) by mouth 3 (three) times daily.    Dispense:  270 tablet    Refill:  3  . DISCONTD: sertraline (ZOLOFT) 50 MG tablet    Sig: Take 1 tablet (50 mg total) by mouth daily.    Dispense:  90 tablet    Refill:  3  . DISCONTD: simvastatin (ZOCOR) 20 MG tablet    Sig: Take 1 tablet (20 mg total) by mouth every evening.    Dispense:  90 tablet    Refill:  3  . simvastatin (ZOCOR) 20 MG tablet    Sig: Take 1 tablet (20 mg total) by mouth every evening.    Dispense:  90 tablet    Refill:  3  . sertraline (ZOLOFT) 50 MG tablet    Sig: Take 1 tablet (50 mg total) by mouth daily.    Dispense:  90 tablet    Refill:  3  . oxybutynin (DITROPAN) 5 MG tablet    Sig: Take 1 tablet (5 mg total) by mouth 3 (three) times daily.    Dispense:  270 tablet    Refill:  3  . metoprolol succinate (TOPROL-XL) 25 MG 24 hr tablet    Sig: Take 1 tablet (25 mg total) by mouth daily.    Dispense:  90 tablet    Refill:  3  . metFORMIN (GLUCOPHAGE) 500 MG tablet    Sig: TAKE 1 TABLET BY MOUTH TWO  TIMES DAILY WITH A MEAL    Dispense:  180 tablet    Refill:  3  . losartan-hydrochlorothiazide (HYZAAR) 100-25 MG tablet    Sig: Take 1 tablet by mouth daily.    Dispense:  90 tablet    Refill:  3  . levothyroxine (SYNTHROID, LEVOTHROID) 50 MCG tablet    Sig: Take 1 tablet (50 mcg total) by mouth daily.    Dispense:  90 tablet    Refill:  3  . Lancets MISC    Sig: Test blood sugar daily. Dx code: 250.00    Dispense:  100 each    Refill:  3  . amLODipine (NORVASC) 10 MG tablet    Sig: Take 1 tablet (10 mg total) by mouth daily.    Dispense:  90 tablet    Refill:  3  . glucose blood (ACCU-CHEK AVIVA PLUS) test strip    Sig: Use to test blood sugar  once daily  Dx: DMII 250.00    Dispense:   100 each    Refill:  3    Return in about 6 months (around 08/07/2016) for recheck diabetes, high blood pressure, high cholesterol.   Bellamy Rubey Paulita Fujita, M.D. [facility] [address] [place], [place]  [address] [phone] phone [phone] fax

## 2016-02-07 NOTE — Patient Instructions (Addendum)
   IF you received an x-ray today, you will receive an invoice from [facility]. Please contact [facility] at [phone] with questions or concerns regarding your invoice.   IF you received labwork today, you will receive an invoice from [facility]. Please contact [facility] at [phone] with questions or concerns regarding your invoice.   Our billing staff will not be able to assist you with questions regarding bills from these companies.  You will be contacted with the lab results as soon as they are available. The fastest way to get your results is to activate your My Chart account. Instructions are located on the last page of this paperwork. If you have not heard from us regarding the results in 2 weeks, please contact this office.    Keeping You Healthy  Get These Tests  Blood Pressure- Have your blood pressure checked by your healthcare provider at least once a year.  Normal blood pressure is 120/80.  Weight- Have your body mass index (BMI) calculated to screen for obesity.  BMI is a measure of body fat based on height and weight.  You can calculate your own BMI at www.nhlbisupport.com/bmi/  Cholesterol- Have your cholesterol checked every year.  Diabetes- Have your blood sugar checked every year if you have high blood pressure, high cholesterol, a family history of diabetes or if you are overweight.  Pap Test - Have a pap test every 1 to 5 years if you have been sexually active.  If you are older than 65 and recent pap tests have been normal you may not need additional pap tests.  In addition, if you have had a hysterectomy  for benign disease additional pap tests are not necessary.  Mammogram-Yearly mammograms are essential for early detection of breast cancer  Screening for Colon Cancer- Colonoscopy starting at age 50. Screening may begin sooner depending on your family history and other health conditions.  Follow up colonoscopy  as directed by your Gastroenterologist.  Screening for Osteoporosis- Screening begins at age 65 with bone density scanning, sooner if you are at higher risk for developing Osteoporosis.  Get these medicines  Calcium with Vitamin D- Your body requires 1200-1500 mg of Calcium a day and 800-1000 IU of Vitamin D a day.  You can only absorb 500 mg of Calcium at a time therefore Calcium must be taken in 2 or 3 separate doses throughout the day.  Hormones- Hormone therapy has been associated with increased risk for certain cancers and heart disease.  Talk to your healthcare provider about if you need relief from menopausal symptoms.  Aspirin- Ask your healthcare provider about taking Aspirin to prevent Heart Disease and Stroke.  Get these Immuniztions  Flu shot- Every fall  Pneumonia shot- Once after the age of 65; if you are younger ask your healthcare provider if you need a pneumonia shot.  Tetanus- Every ten years.  Zostavax- Once after the age of 60 to prevent shingles.  Take these steps  Don't smoke- Your healthcare provider can help you quit. For tips on how to quit, ask your healthcare provider or go to www.smokefree.gov or call 1-800 QUIT-NOW.  Be physically active- Exercise 5 days a week for a minimum of 30 minutes.  If you are not already physically active, start slow and gradually work up to 30 minutes of moderate physical activity.  Try walking, dancing, bike riding, swimming, etc.  Eat a healthy diet- Eat a variety of healthy foods such as fruits, vegetables, whole   grains, low fat milk, low fat cheeses, yogurt, lean meats, chicken, fish, eggs, dried beans, tofu, etc.  For more information go to www.thenutritionsource.org  Dental visit- Brush and floss teeth twice daily; visit your dentist twice a year.  Eye exam- Visit your Optometrist or Ophthalmologist yearly.  Drink alcohol in moderation- Limit alcohol intake to one drink or less a day.  Never drink and  drive.  Depression- Your emotional health is as important as your physical health.  If you're feeling down or losing interest in things you normally enjoy, please talk to your healthcare provider.  Seat Belts- can save your life; always wear one  Smoke/Carbon Monoxide detectors- These detectors need to be installed on the appropriate level of your home.  Replace batteries at least once a year.  Violence- If anyone is threatening or hurting you, please tell your healthcare provider.  Living Will/ Health care power of attorney- Discuss with your healthcare provider and family. 

## 2016-02-08 LAB — HEMOGLOBIN A1C
ESTIMATED AVERAGE GLUCOSE: 117 mg/dL
Hgb A1c MFr Bld: 5.7 % — ABNORMAL HIGH (ref 4.8–5.6)

## 2016-02-08 LAB — CBC WITH DIFFERENTIAL/PLATELET
BASOS ABS: 0 10*3/uL (ref 0.0–0.2)
BASOS: 0 %
EOS (ABSOLUTE): 0.1 10*3/uL (ref 0.0–0.4)
Eos: 1 %
HEMOGLOBIN: 13.3 g/dL (ref 11.1–15.9)
Hematocrit: 39.2 % (ref 34.0–46.6)
IMMATURE GRANS (ABS): 0 10*3/uL (ref 0.0–0.1)
Immature Granulocytes: 0 %
LYMPHS: 28 %
Lymphocytes Absolute: 2.3 10*3/uL (ref 0.7–3.1)
MCH: 28.8 pg (ref 26.6–33.0)
MCHC: 33.9 g/dL (ref 31.5–35.7)
MCV: 85 fL (ref 79–97)
MONOCYTES: 7 %
Monocytes Absolute: 0.6 10*3/uL (ref 0.1–0.9)
NEUTROS ABS: 5.2 10*3/uL (ref 1.4–7.0)
Neutrophils: 64 %
Platelets: 285 10*3/uL (ref 150–379)
RBC: 4.62 x10E6/uL (ref 3.77–5.28)
RDW: 13.5 % (ref 12.3–15.4)
WBC: 8.2 10*3/uL (ref 3.4–10.8)

## 2016-02-08 LAB — COMPREHENSIVE METABOLIC PANEL
ALBUMIN: 4.4 g/dL (ref 3.5–4.7)
ALT: 14 IU/L (ref 0–32)
AST: 14 IU/L (ref 0–40)
Albumin/Globulin Ratio: 1.8 (ref 1.2–2.2)
Alkaline Phosphatase: 56 IU/L (ref 39–117)
BILIRUBIN TOTAL: 0.5 mg/dL (ref 0.0–1.2)
BUN / CREAT RATIO: 34 — AB (ref 12–28)
BUN: 27 mg/dL (ref 8–27)
CHLORIDE: 94 mmol/L — AB (ref 96–106)
CO2: 24 mmol/L (ref 18–29)
CREATININE: 0.79 mg/dL (ref 0.57–1.00)
Calcium: 10 mg/dL (ref 8.7–10.3)
GFR, EST AFRICAN AMERICAN: 81 mL/min/{1.73_m2} (ref >59)
GFR, EST NON AFRICAN AMERICAN: 70 mL/min/{1.73_m2} (ref >59)
GLUCOSE: 97 mg/dL (ref 65–99)
Globulin, Total: 2.4 g/dL (ref 1.5–4.5)
Potassium: 4.1 mmol/L (ref 3.5–5.2)
Sodium: 137 mmol/L (ref 134–144)
TOTAL PROTEIN: 6.8 g/dL (ref 6.0–8.5)

## 2016-02-08 LAB — LIPID PANEL
CHOL/HDL RATIO: 2.7 ratio (ref 0.0–4.4)
Cholesterol, Total: 155 mg/dL (ref 100–199)
HDL: 57 mg/dL (ref >39)
LDL CALC: 70 mg/dL (ref 0–99)
Triglycerides: 139 mg/dL (ref 0–149)
VLDL CHOLESTEROL CAL: 28 mg/dL (ref 5–40)

## 2016-02-08 LAB — TSH: TSH: 0.6 u[IU]/mL (ref 0.450–4.500)

## 2016-02-10 NOTE — Telephone Encounter (Signed)
02/07/16 last visit Next visit 08/07/16  Ok to refill per protocol

## 2016-03-03 MED ORDER — SERTRALINE HCL 50 MG PO TABS: 50.0000 mg | ORAL_TABLET | Freq: Every day | ORAL | 3 refills | Status: DC

## 2016-03-03 MED ORDER — METOPROLOL SUCCINATE ER 25 MG PO TB24: 25.0000 mg | ORAL_TABLET | Freq: Every day | ORAL | 3 refills | Status: DC

## 2016-03-03 MED ORDER — OXYBUTYNIN CHLORIDE 5 MG PO TABS: 5.0000 mg | ORAL_TABLET | Freq: Three times a day (TID) | ORAL | 3 refills | Status: DC

## 2016-03-03 MED ORDER — AMLODIPINE BESYLATE 10 MG PO TABS: 10.0000 mg | ORAL_TABLET | Freq: Every day | ORAL | 3 refills | Status: DC

## 2016-03-03 MED ORDER — LANCETS MISC: 3 refills | Status: DC

## 2016-03-03 MED ORDER — METFORMIN HCL 500 MG PO TABS: ORAL_TABLET | ORAL | 3 refills | Status: DC

## 2016-03-03 MED ORDER — LEVOTHYROXINE SODIUM 50 MCG PO TABS: 50.0000 ug | ORAL_TABLET | Freq: Every day | ORAL | 3 refills | Status: DC

## 2016-03-03 MED ORDER — LOSARTAN POTASSIUM-HCTZ 100-25 MG PO TABS: 1.0000 | ORAL_TABLET | Freq: Every day | ORAL | 3 refills | Status: DC

## 2016-03-03 MED ORDER — SIMVASTATIN 20 MG PO TABS: 20.0000 mg | ORAL_TABLET | Freq: Every evening | ORAL | 3 refills | Status: DC

## 2016-03-03 MED ORDER — GLUCOSE BLOOD VI STRP: ORAL_STRIP | 3 refills | Status: DC

## 2016-03-03 MED ORDER — ALENDRONATE SODIUM 70 MG PO TABS: ORAL_TABLET | ORAL | 3 refills | Status: DC

## 2016-03-26 ENCOUNTER — Ambulatory Visit: Payer: Self-pay | Admitting: Cardiovascular Disease

## 2016-03-27 ENCOUNTER — Encounter: Payer: Self-pay | Admitting: *Deleted

## 2016-04-02 IMAGING — CR DG CHEST 1V PORT
1 series · 1 of 1 positions shown · non-contrast
Comparison: None.

CLINICAL DATA: Confusion, disorientation, and shortness of breath
for several weeks.

EXAM:
PORTABLE CHEST - 1 VIEW

[x chest ap]
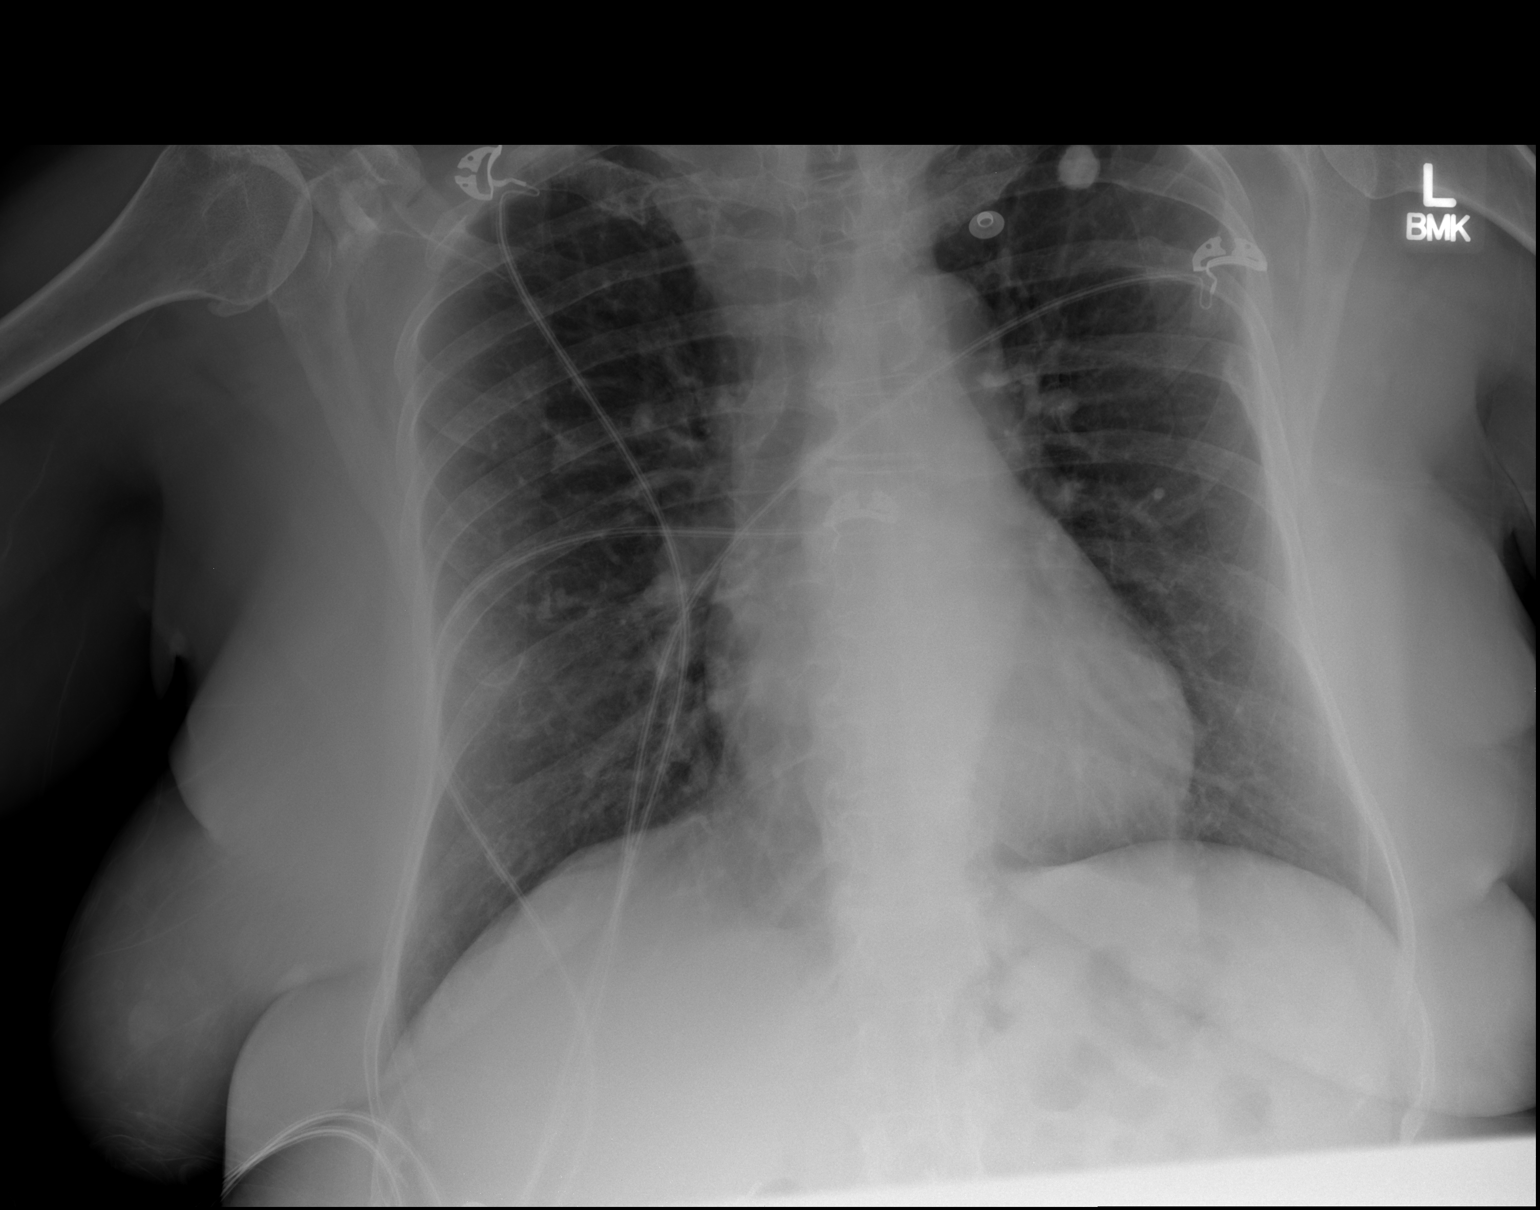

[1 of 1 positions shown; findings below may reference images not displayed]

FINDINGS: Normal heart size and pulmonary vascularity. No focal airspace
disease or consolidation in the lungs. No blunting of costophrenic
angles. No pneumothorax. Mediastinal contours appear intact.
Degenerative changes in the spine and shoulders.
IMPRESSION: No active disease.

## 2016-04-02 IMAGING — CT CT HEAD WITHOUT CONTRAST
1 series · 16 of 30 positions shown, 20 images · non-contrast
Comparison: None.

CLINICAL DATA: Altered mental status. Difficulty walking for
several weeks. Cough.

EXAM:
CT HEAD WITHOUT CONTRAST
TECHNIQUE: Contiguous axial images were obtained from the base of the skull
through the vertex without intravenous contrast.

[Series 2: head wo · axial · 0.39mm/px · z∈[-6,+123]mm · 16 of 32 slices shown, 20 images]
[im 2/32  brain]
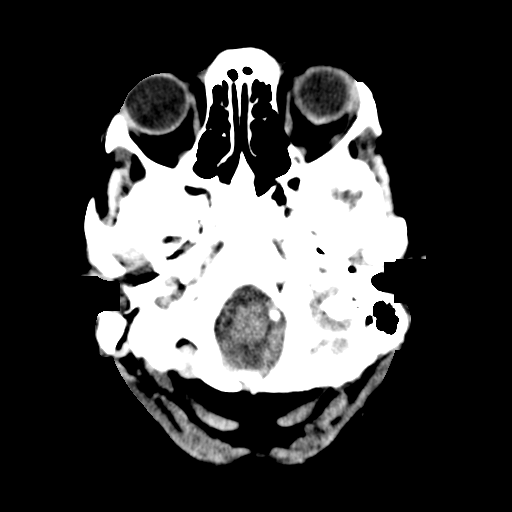
[im 2/32  bone]
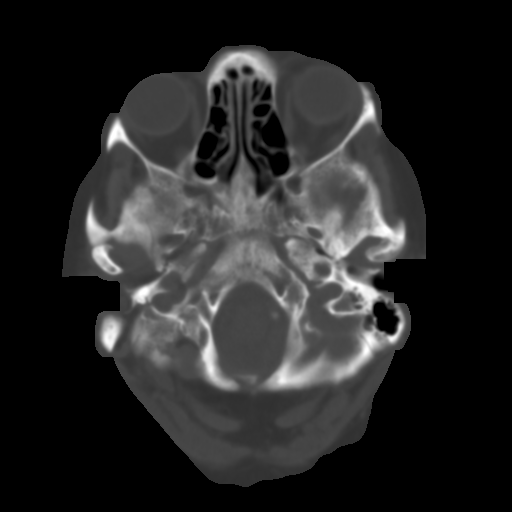
[im 4/32  brain]
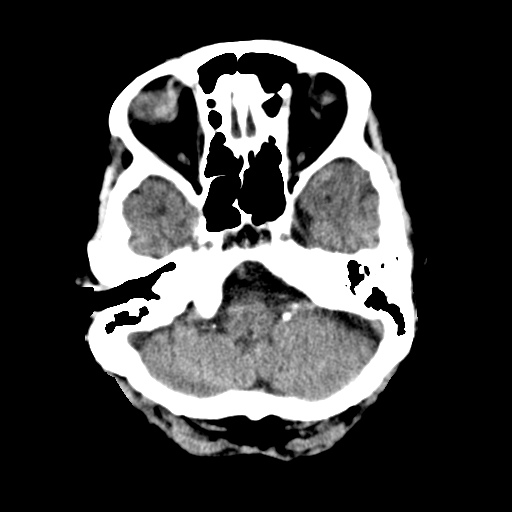
[im 6/32  brain]
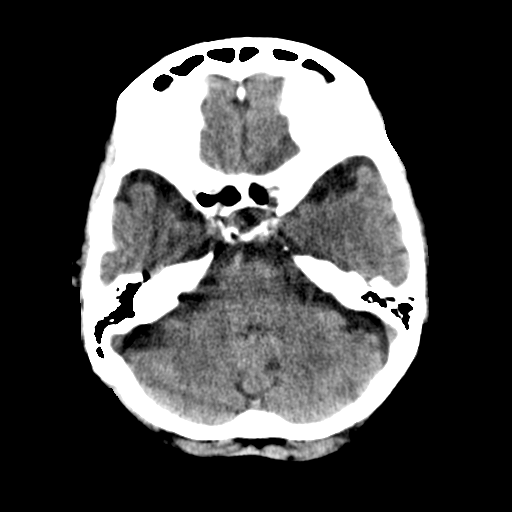
[im 8/32  brain]
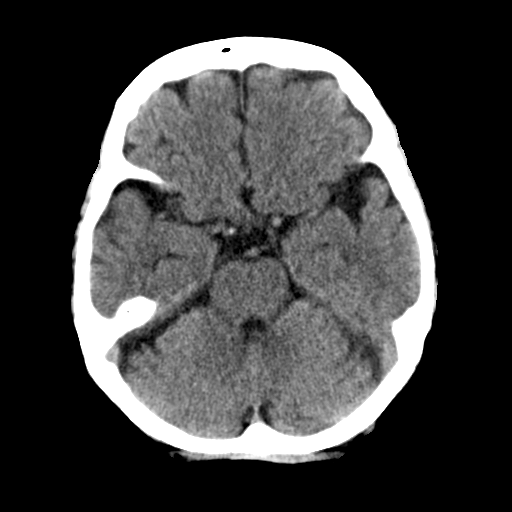
[im 9/32  brain]
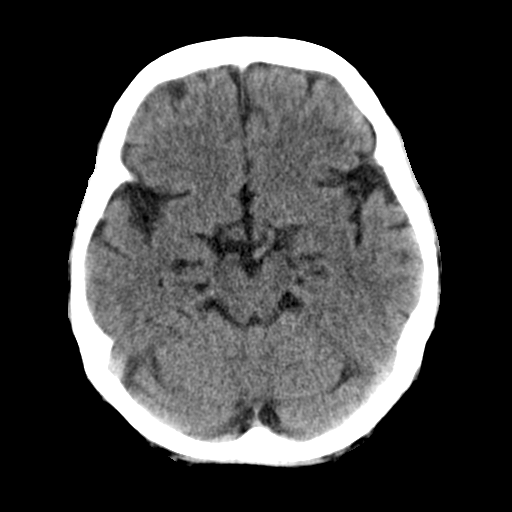
[im 9/32  bone]
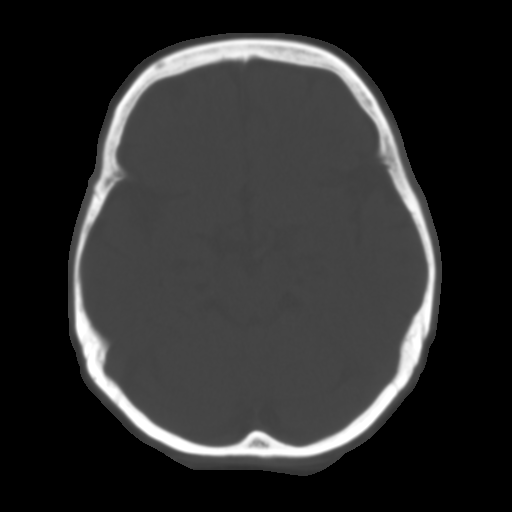
[im 11/32  brain]
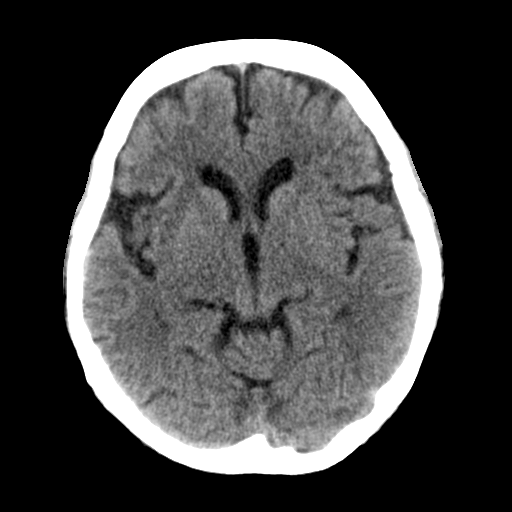
[im 13/32  brain]
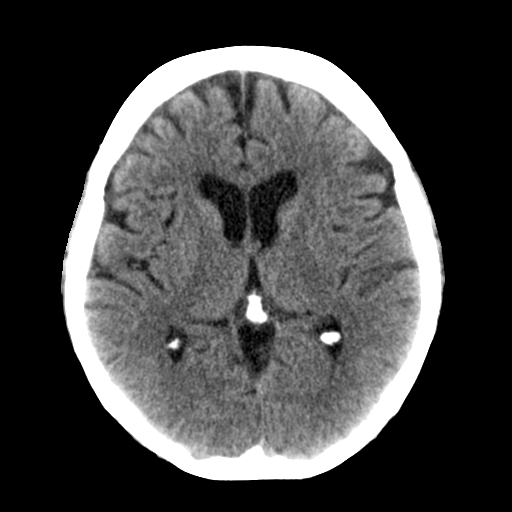
[im 15/32  brain]
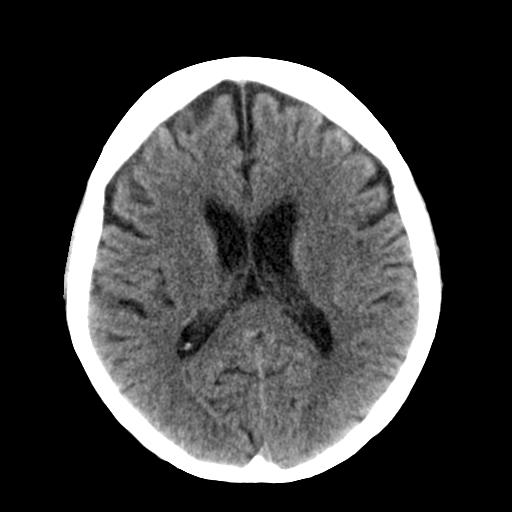
[im 17/32  brain]
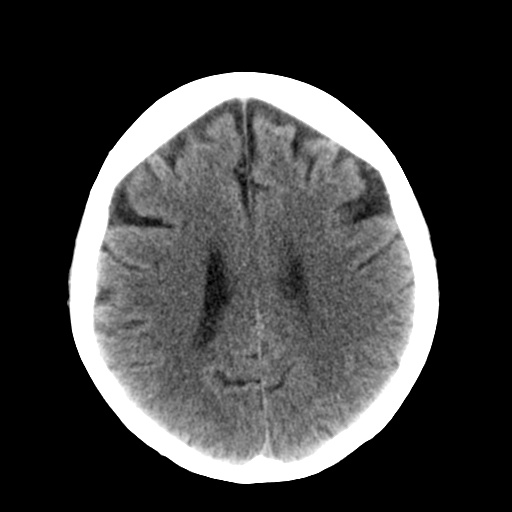
[im 17/32  bone]
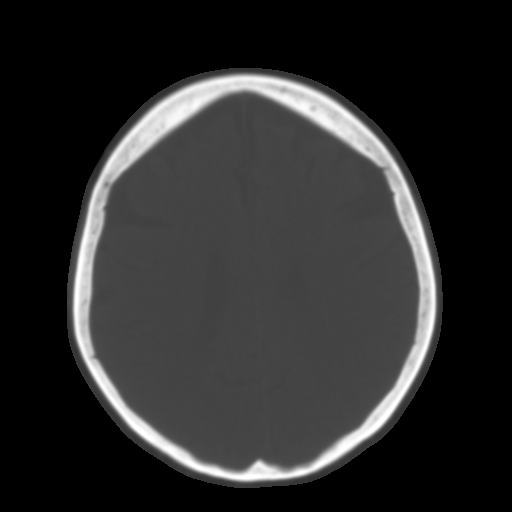
[im 19/32  brain]
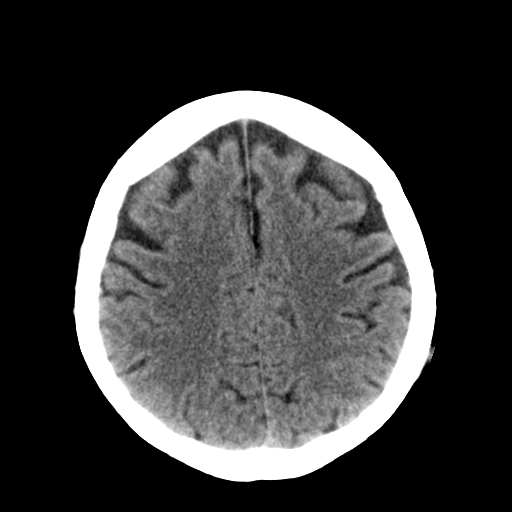
[im 21/32  brain]
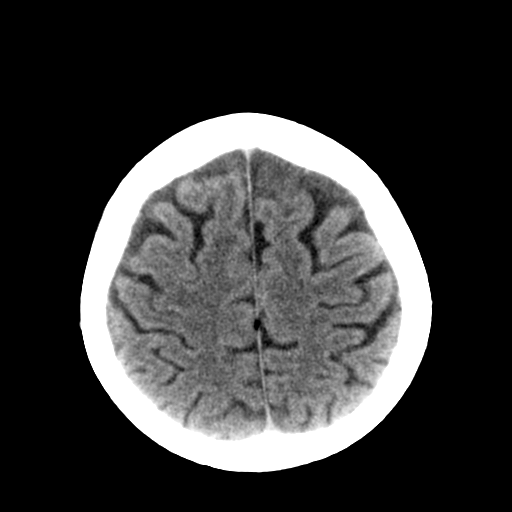
[im 23/32  brain]
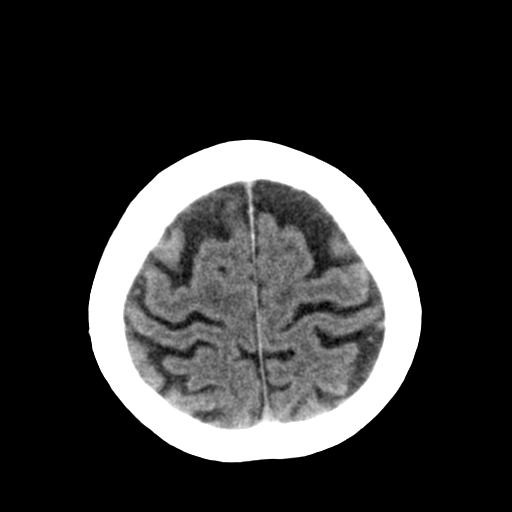
[im 24/32  brain]
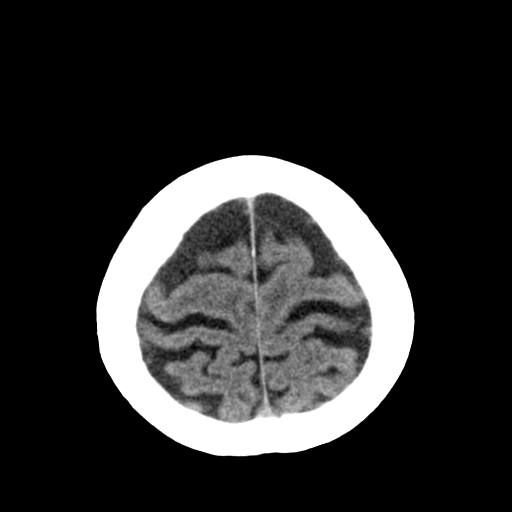
[im 24/32  bone]
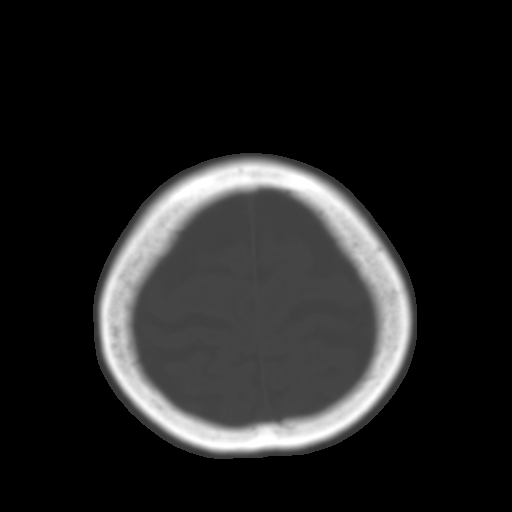
[im 26/32  brain]
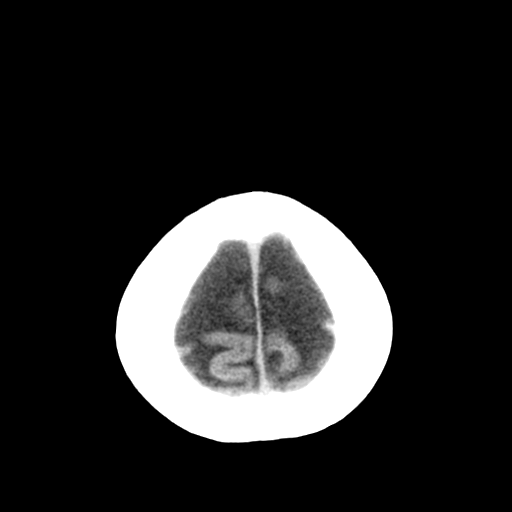
[im 28/32  brain]
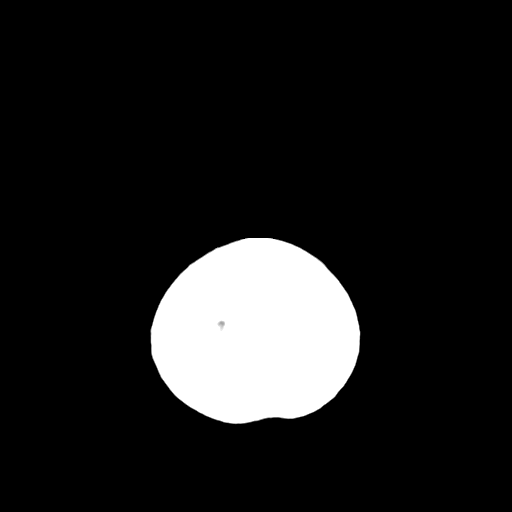
[im 30/32  brain]
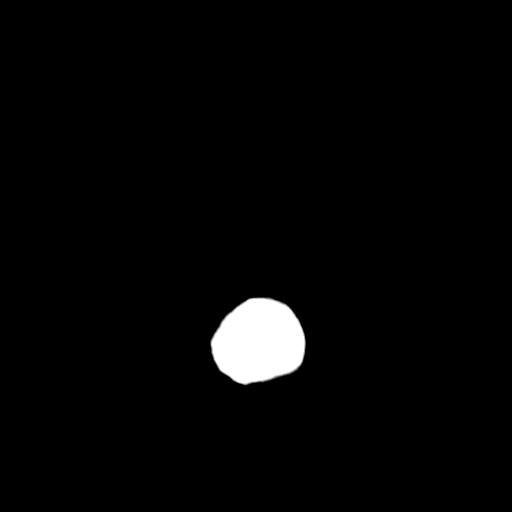

[16 of 30 positions shown; findings below may reference images not displayed]

FINDINGS: Diffuse cerebral atrophy. Patchy low-attenuation changes in the deep
white matter consistent with small vessel ischemia. Ventricles are
not dilated. No mass effect or midline shift. No abnormal
extra-axial fluid collections. Gray-white matter junctions are
distinct. Basal cisterns are not effaced. No evidence of acute
intracranial hemorrhage. No depressed skull fractures. Mild mucosal
thickening in the paranasal sinuses.
IMPRESSION: No acute intracranial abnormalities. Mild chronic atrophy and small
vessel ischemic changes.

## 2016-04-30 ENCOUNTER — Ambulatory Visit (INDEPENDENT_AMBULATORY_CARE_PROVIDER_SITE_OTHER): Payer: Medicare Other | Admitting: Cardiology

## 2016-04-30 ENCOUNTER — Encounter: Payer: Self-pay | Admitting: Cardiology

## 2016-04-30 VITALS — BP 111/60 | HR 61 | Ht 63.0 in | Wt 181.5 lb

## 2016-04-30 DIAGNOSIS — R002 Palpitations: Secondary | ICD-10-CM | POA: Diagnosis not present

## 2016-04-30 DIAGNOSIS — E7849 Other hyperlipidemia: Secondary | ICD-10-CM

## 2016-04-30 DIAGNOSIS — I10 Essential (primary) hypertension: Secondary | ICD-10-CM | POA: Diagnosis not present

## 2016-04-30 DIAGNOSIS — R0602 Shortness of breath: Secondary | ICD-10-CM

## 2016-04-30 DIAGNOSIS — E784 Other hyperlipidemia: Secondary | ICD-10-CM | POA: Diagnosis not present

## 2016-04-30 DIAGNOSIS — R079 Chest pain, unspecified: Secondary | ICD-10-CM | POA: Diagnosis not present

## 2016-04-30 NOTE — Patient Instructions (Addendum)
Testing/Procedures: Your physician has recommended that you wear an event monitor. Event monitors are medical devices that record the heart's electrical activity. Doctors most often [place] these monitors to diagnose arrhythmias. Arrhythmias are problems with the speed or rhythm of the heartbeat. The monitor is a small, portable device. You can wear one while you do your normal daily activities. This is usually used to diagnose what is causing palpitations/syncope (passing out).  Your physician has requested that you have an echocardiogram. Echocardiography is a painless test that uses sound waves to create images of your heart. It provides your doctor with information about the size and shape of your heart and how well your heart's chambers and valves are working. This procedure takes approximately one hour. There are no restrictions for this procedure.  [facility] MYOVIEW  Your caregiver has ordered a Stress Test with nuclear imaging. The purpose of this test is to evaluate the blood supply to your heart muscle. This procedure is referred to as a "Non-Invasive Stress Test." This is because other than having an IV started in your vein, nothing is inserted or "invades" your body. Cardiac stress tests are done to find areas of poor blood flow to the heart by determining the extent of coronary artery disease (CAD). Some patients exercise on a treadmill, which naturally increases the blood flow to your heart, while others who are  unable to walk on a treadmill due to physical limitations have a pharmacologic/chemical stress agent called Lexiscan . This medicine will mimic walking on a treadmill by temporarily increasing your coronary blood flow.   Please note: these test may take anywhere between 2-4 hours to complete  PLEASE REPORT TO [facility]  THE VOLUNTEERS AT THE FIRST DESK WILL DIRECT YOU WHERE TO GO  Date of Procedure:_Wednesday May 08, 2016 at 08:00AM_  Arrival Time for Procedure:_Arrive  at 07:45AM to register__  Instructions regarding medication:   __X__ : Hold diabetes medication Metformin (Glucophage) the morning of procedure  __X__:  Hold Metoprolol the night before procedure and morning of procedure    PLEASE NOTIFY THE OFFICE AT LEAST 24 HOURS IN ADVANCE IF YOU ARE UNABLE TO KEEP YOUR APPOINTMENT.  [phone] AND  PLEASE NOTIFY NUCLEAR MEDICINE AT [facility] AT LEAST 24 HOURS IN ADVANCE IF YOU ARE UNABLE TO KEEP YOUR APPOINTMENT. [phone]  How to prepare for your Myoview test:  1. Do not eat or drink after midnight 2. No caffeine for 24 hours prior to test 3. No smoking 24 hours prior to test. 4. Your medication may be taken with water.  If your doctor stopped a medication because of this test, do not take that medication. 5. Ladies, please do not wear dresses.  Skirts or pants are appropriate. Please wear a short sleeve shirt. 6. No perfume, cologne or lotion. 7. Wear comfortable walking shoes. No heels!    Follow-Up: Your physician recommends that you schedule a follow-up appointment as needed with Dr. Alvino Chapel. We will call you with results and if needed schedule follow up at that time.   It was a pleasure seeing you today here in the office. Please do not hesitate to give [place] a call back if you have any further questions. [phone]  Quitman Cellar RN, BSN    Cardiac Event Monitoring A cardiac event monitor is a small recording device that is used to detect abnormal heart rhythms (arrhythmias). The monitor is used to record your heart rhythm when you have symptoms, such as:  Fast heartbeats (palpitations),  such as heart racing or fluttering.  Dizziness.  Fainting or light-headedness.  Unexplained weakness. Some monitors are wired to electrodes placed on your chest. Electrodes are flat, sticky disks that attach to your skin. Other monitors may be hand-held or worn on the wrist. The monitor can be worn for up to 30 days. If the monitor is attached to  your chest, a technician will prepare your chest for the electrode placement and show you how to work the monitor. Take time to practice using the monitor before you leave the office. Make sure you understand how to send the information from the monitor to your health care provider. In some cases, you may need to use a landline telephone instead of a cell phone. What are the risks? Generally, this device is safe to use, but it possible that the skin under the electrodes will become irritated. How to use your cardiac event monitor  Wear your monitor at all times, except when you are in water:  Do not let the monitor get wet.  Take the monitor off when you bathe. Do not swim or use a hot tub with it on.  Keep your skin clean. Do not put body lotion or moisturizer on your chest.  Change the electrodes as told by your health care provider or any time they stop sticking to your skin. You may need to use medical tape to keep them on.  Try to put the electrodes in slightly different places on your chest to help prevent skin irritation. They must remain in the area under your left breast and in the upper right section of your chest.  Make sure the monitor is safely clipped to your clothing or in a location close to your body that your health care provider recommends.  Press the button to record as soon as you feel heart-related symptoms, such as:  Dizziness.  Weakness.  Light-headedness.  Palpitations.  Thumping or pounding in your chest.  Shortness of breath.  Unexplained weakness.  Keep a diary of your activities, such as walking, doing chores, and taking medicine. It is very important to note what you were doing when you pushed the button to record your symptoms. This will help your health care provider determine what might be contributing to your symptoms.  Send the recorded information as recommended by your health care provider. It may take some time for your health care provider  to process the results.  Change the batteries as told by your health care provider.  Keep electronic devices away from your monitor. This includes:  Tablets.  MP3 players.  Cell phones.  While wearing your monitor you should avoid:  Electric blankets.  Firefighter.  Electric toothbrushes.  Microwave ovens.  Magnets.  Metal detectors. Get help right away if:  You have chest pain.  You have extreme difficulty breathing or shortness of breath.  You develop a very fast heartbeat that persists.  You develop dizziness that does not go away.  You faint or constantly feel like you are about to faint. Summary  A cardiac event monitor is a small recording device that is used to help detect abnormal heart rhythms (arrhythmias).  The monitor is used to record your heart rhythm when you have heart-related symptoms.  Make sure you understand how to send the information from the monitor to your health care provider.  It is important to press the button on the monitor when you have any heart-related symptoms.  Keep a diary of your activities,  such as walking, doing chores, and taking medicine. It is very important to note what you were doing when you pushed the button to record your symptoms. This will help your health care provider learn what might be causing your symptoms. This information is not intended to replace advice given to you by your health care provider. Make sure you discuss any questions you have with your health care provider. Document Released: 11/28/2007 Document Revised: 02/03/2016 Document Reviewed: 02/03/2016 Elsevier Interactive Patient Education  2017 [employer]. Echocardiogram An echocardiogram, or echocardiography, uses sound waves (ultrasound) to produce an image of your heart. The echocardiogram is simple, painless, obtained within a short period of time, and offers valuable information to your health care provider. The images from an  echocardiogram can provide information such as:  Evidence of coronary artery disease (CAD).  Heart size.  Heart muscle function.  Heart valve function.  Aneurysm detection.  Evidence of a past heart attack.  Fluid buildup around the heart.  Heart muscle thickening.  Assess heart valve function. Tell a health care provider about:  Any allergies you have.  All medicines you are taking, including vitamins, herbs, eye drops, creams, and over-the-counter medicines.  Any problems you or family members have had with anesthetic medicines.  Any blood disorders you have.  Any surgeries you have had.  Any medical conditions you have.  Whether you are pregnant or may be pregnant. What happens before the procedure? No special preparation is needed. Eat and drink normally. What happens during the procedure?  In order to produce an image of your heart, gel will be applied to your chest and a wand-like tool (transducer) will be moved over your chest. The gel will help transmit the sound waves from the transducer. The sound waves will harmlessly bounce off your heart to allow the heart images to be captured in real-time motion. These images will then be recorded.  You may need an IV to receive a medicine that improves the quality of the pictures. What happens after the procedure? You may return to your normal schedule including diet, activities, and medicines, unless your health care provider tells you otherwise. This information is not intended to replace advice given to you by your health care provider. Make sure you discuss any questions you have with your health care provider. Document Released: 02/16/2000 Document Revised: 10/07/2015 Document Reviewed: 10/26/2012 Elsevier Interactive Patient Education  2017 [employer]vier Inc. Pharmacologic Stress Electrocardiogram Introduction A pharmacologic stress electrocardiogram is a heart (cardiac) test that uses nuclear imaging to evaluate the  blood supply to your heart. This test may also be called a pharmacologic stress electrocardiography. Pharmacologic means that a medicine is used to increase your heart rate and blood pressure. This stress test is done to find areas of poor blood flow to the heart by determining the extent of coronary artery disease (CAD). Some people exercise on a treadmill, which naturally increases the blood flow to the heart. For those people unable to exercise on a treadmill, a medicine is used. This medicine stimulates your heart and will cause your heart to beat harder and more quickly, as if you were exercising. Pharmacologic stress tests can help determine:  The adequacy of blood flow to your heart during increased levels of activity in order to clear you for discharge home.  The extent of coronary artery blockage caused by CAD.  Your prognosis if you have suffered a heart attack.  The effectiveness of cardiac procedures done, such as an angioplasty, which can  increase the circulation in your coronary arteries.  Causes of chest pain or pressure. LET [facility] CARE PROVIDER KNOW ABOUT:  Any allergies you have.  All medicines you are taking, including vitamins, herbs, eye drops, creams, and over-the-counter medicines.  Previous problems you or members of your family have had with the use of anesthetics.  Any blood disorders you have.  Previous surgeries you have had.  Medical conditions you have.  Possibility of pregnancy, if this applies.  If you are currently breastfeeding. RISKS AND COMPLICATIONS Generally, this is a safe procedure. However, as with any procedure, complications can occur. Possible complications include:  You develop pain or pressure in the following areas:  Chest.  Jaw or neck.  Between your shoulder blades.  Radiating down your left arm.  Headache.  Dizziness or light-headedness.  Shortness of breath.  Increased or irregular heartbeat.  Low blood  pressure.  Nausea or vomiting.  Flushing.  Redness going up the arm and slight pain during injection of medicine.  Heart attack (rare). BEFORE THE PROCEDURE  Avoid all forms of caffeine for 24 hours before your test or as directed by your health care provider. This includes coffee, tea (even decaffeinated tea), caffeinated sodas, chocolate, cocoa, and certain pain medicines.  Follow your health care provider's instructions regarding eating and drinking before the test.  Take your medicines as directed at regular times with water unless instructed otherwise. Exceptions may include:  If you have diabetes, ask how you are to take your insulin or pills. It is common to adjust insulin dosing the morning of the test.  If you are taking beta-blocker medicines, it is important to talk to your health care provider about these medicines well before the date of your test. Taking beta-blocker medicines may interfere with the test. In some cases, these medicines need to be changed or stopped 24 hours or more before the test.  If you wear a nitroglycerin patch, it may need to be removed prior to the test. Ask your health care provider if the patch should be removed before the test.  If you use an inhaler for any breathing condition, bring it with you to the test.  If you are an outpatient, bring a snack so you can eat right after the stress phase of the test.  Do not smoke for 4 hours prior to the test or as directed by your health care provider.  Do not apply lotions, powders, creams, or oils on your chest prior to the test.  Wear comfortable shoes and clothing. Let your health care provider know if you were unable to complete or follow the preparations for your test. PROCEDURE  Multiple patches (electrodes) will be put on your chest. If needed, small areas of your chest may be shaved to get better contact with the electrodes. Once the electrodes are attached to your body, multiple wires will  be attached to the electrodes, and your heart rate will be monitored.  An IV access will be started. A nuclear trace (isotope) is given. The isotope may be given intravenously, or it may be swallowed. Nuclear refers to several types of radioactive isotopes, and the nuclear isotope lights up the arteries so that the nuclear images are clear. The isotope is absorbed by your body. This results in low radiation exposure.  A resting nuclear image is taken to show how your heart functions at rest.  A medicine is given through the IV access.  A second scan is done about 1 hour  after the medicine injection and determines how your heart functions under stress.  During this stress phase, you will be connected to an electrocardiogram machine. Your blood pressure and oxygen levels will be monitored. What to expect after the procedure  Your heart rate and blood pressure will be monitored after the test.  You may return to your normal schedule, including diet,activities, and medicines, unless your health care provider tells you otherwise. This information is not intended to replace advice given to you by your health care provider. Make sure you discuss any questions you have with your health care provider. Document Released: 07/07/2008 Document Revised: 07/27/2015 Document Reviewed: 08/28/2015 Elsevier Interactive Patient Education  2017 [employer]

## 2016-04-30 NOTE — Progress Notes (Signed)
Cardiology Office Note   Date:  04/30/2016   ID:  Kara Rogers, DOB 12/03/1931, MRN [id number]  Referring Doctor:  Nilda Simmer, MD   Cardiologist:   Almond Lint, MD   Reason for consultation:  Chief Complaint  Patient presents with  . OTHER    Ref PCP due to chest tightness and heart flutter.  Meds reviewed verbally with pt.      History of Present Illness: Kara Rogers is a 83 y.o. female who presents for Chest pain, fluttering in the chest  Chest pain onset 6 months ago. She describes it as a tightness in the chest, mild in intensity, nonradiating, last a few seconds or minutes. Randomly occurring.  She also has shortness of breath with exertion. She is not a very active person and she does get short of breath with moderate exertion.  Also reports fluttering in the chest, occurring every other day, not precipitated by anything in particular, last a minute or so, resolving spontaneously.  Patient denies PND, orthopnea, edema. No syncope.   ROS:  Please see the history of present illness. Aside from mentioned under HPI, all other systems are reviewed and negative.     Past Medical History:  Diagnosis Date  . Chicken pox   . Essential hypertension, benign   . Hypercalcemia   . Leg cramps   . Measles   . Mumps   . Osteoporosis   . Personal history of colonic polyps   . Pure hypercholesterolemia   . Type II or unspecified type diabetes mellitus without mention of complication, not stated as uncontrolled   . Unspecified disorder of skin and subcutaneous tissue   . Unspecified hypothyroidism   . Unspecified vitamin D deficiency     Past Surgical History:  Procedure Laterality Date  . ABDOMINAL HYSTERECTOMY  1980   R Ovary intact.  DUB.  Marland Kitchen Breast Ductal Resection  02/2010   Left-[place]. Resection of thickened breast duct. Symptoms: L breast drainage hemocult +  . Cataract surgery  02/2011   Left; Right 2009  . CHOLECYSTECTOMY    . HERNIA REPAIR   1981  . NECK SURGERY     L neck cyst resection  . THYROID CYST EXCISION       reports that she has never smoked. She uses smokeless tobacco. She reports that she does not drink alcohol or use drugs.   family history includes Arthritis in her mother; Cancer in her father and sister; Cirrhosis in her brother; Diabetes in her sister; Osteoporosis in her mother; Seizures in her sister; Stroke in her daughter.   Outpatient Medications Prior to Visit  Medication Sig Dispense Refill  . alendronate (FOSAMAX) 70 MG tablet TAKE 1 TABLET BY MOUTH  EVERY 7 DAYS WITH A FULL  GLASS OF WATER ON AN EMPTY  STOMACH 12 tablet 3  . amLODipine (NORVASC) 10 MG tablet Take 1 tablet (10 mg total) by mouth daily. 90 tablet 3  . Ascorbic Acid (VITAMIN C) 1000 MG tablet Take 1,000 mg by mouth daily.    Marland Kitchen aspirin 81 MG tablet Take 81 mg by mouth daily.    . cholecalciferol (VITAMIN D) 1000 UNITS tablet Take 1,000 Units by mouth daily.    Marland Kitchen donepezil (ARICEPT) 10 MG tablet Take 10 mg by mouth at bedtime.     Marland Kitchen glucose blood (ACCU-CHEK AVIVA PLUS) test strip Use to test blood sugar  once daily  Dx: DMII 250.00 100 each 3  . Lancets MISC Test blood  sugar daily. Dx code: 250.00 100 each 3  . levothyroxine (SYNTHROID, LEVOTHROID) 50 MCG tablet Take 1 tablet (50 mcg total) by mouth daily. 90 tablet 3  . losartan-hydrochlorothiazide (HYZAAR) 100-25 MG tablet Take 1 tablet by mouth daily. 90 tablet 3  . metFORMIN (GLUCOPHAGE) 500 MG tablet TAKE 1 TABLET BY MOUTH TWO  TIMES DAILY WITH A MEAL 180 tablet 3  . metoprolol succinate (TOPROL-XL) 25 MG 24 hr tablet Take 1 tablet (25 mg total) by mouth daily. 90 tablet 3  . Multiple Vitamin (MULTIVITAMINS PO) Take by mouth.    Marland Kitchen. omeprazole (PRILOSEC) 20 MG capsule TAKE 1 CAPSULE BY MOUTH  DAILY FOR INDIGESTION 90 capsule 0  . oxybutynin (DITROPAN) 5 MG tablet Take 1 tablet (5 mg total) by mouth 3 (three) times daily. 270 tablet 3  . PRESCRIPTION MEDICATION T/S Bayer Contour Test  Strips 100    . sertraline (ZOLOFT) 50 MG tablet Take 1 tablet (50 mg total) by mouth daily. 90 tablet 3  . simvastatin (ZOCOR) 20 MG tablet Take 1 tablet (20 mg total) by mouth every evening. 90 tablet 3   No facility-administered medications prior to visit.      Allergies: Patient has no known allergies.    PHYSICAL EXAM: VS:  BP 111/60 (BP Location: Right Arm, Patient Position: Sitting, Cuff Size: Large)   Pulse 61   Ht 5\' 3"  (1.6 m)   Wt 181 lb 8 oz (82.3 kg)   BMI 32.15 kg/m  , Body mass index is 32.15 kg/m. Wt Readings from Last 3 Encounters:  04/30/16 181 lb 8 oz (82.3 kg)  02/07/16 184 lb 3.2 oz (83.6 kg)  06/20/15 179 lb 12.8 oz (81.6 kg)    GENERAL:  well developed, well nourished, obese, not in acute distress HEENT: normocephalic, pink conjunctivae, anicteric sclerae, no xanthelasma, normal dentition, oropharynx clear NECK:  no neck vein engorgement, JVP normal, no hepatojugular reflux, carotid upstroke brisk and symmetric, no bruit, no thyromegaly, no lymphadenopathy LUNGS:  good respiratory effort, clear to auscultation bilaterally CV:  PMI not displaced, no thrills, no lifts, S1 and S2 within normal limits, no palpable S3 or S4, no murmurs, no rubs, no gallops ABD:  Soft, nontender, nondistended, normoactive bowel sounds, no abdominal aortic bruit, no hepatomegaly, no splenomegaly MS: nontender back, no kyphosis, no scoliosis, no joint deformities EXT:  2+ DP/PT pulses, no edema, no varicosities, no cyanosis, no clubbing SKIN: warm, nondiaphoretic, normal turgor, no ulcers NEUROPSYCH: alert, oriented to person, place, and time, sensory/motor grossly intact, normal mood, appropriate affect  Recent Labs: 06/20/2015: Hemoglobin 13.9 02/07/2016: ALT 14; BUN 27; Creatinine, Ser 0.79; Platelets 285; Potassium 4.1; Sodium 137; TSH 0.600   Lipid Panel    Component Value Date/Time   CHOL 155 02/07/2016 1200   TRIG 139 02/07/2016 1200   HDL 57 02/07/2016 1200   CHOLHDL  2.7 02/07/2016 1200   CHOLHDL 2.7 06/20/2015 0949   VLDL 24 06/20/2015 0949   LDLCALC 70 02/07/2016 1200     Other studies Reviewed:  EKG:  The ekg from 04/30/2016 was personally reviewed by me and it revealed sinus rhythm, 61 BPM.  Additional studies/ records that were reviewed personally reviewed by me today include: None available   ASSESSMENT AND PLAN: Chest pain Shortness of breath with exertion Risk factors for CAD include hypertension, hyperlipidemia, diabetes Recommend further evaluation with echocardiogram and Lexi scan stress test.  Fluttering in the chest, palpitations Not on a daily basis. Recommend long-term monitor.  Hypertension BP  is well controlled. Continue monitoring BP. Continue current medical therapy and lifestyle changes.  Hyperlipidemia Patient on statin therapy, piece B following labs. Ideal LDL goal is less than 70 due to diabetes.   Current medicines are reviewed at length with the patient today.  The patient does not have concerns regarding medicines.  Labs/ tests ordered today include:  Orders Placed This Encounter  Procedures  . NM Myocar Multi W/Spect W/Wall Motion / EF  . LONG TERM MONITOR (3-14 DAYS)  . EKG 12-Lead  . ECHOCARDIOGRAM COMPLETE    I had a lengthy and detailed discussion with the patient regarding diagnoses, prognosis, diagnostic options.  I counseled the patient on importance of lifestyle modification including heart healthy diet, regular physical activity once cardiac workup is completed.   Disposition:   FU with undersigned after tests   Thank you for this consultation. We will forwarding this consultation to referring physician.   Signed, Almond Lint, MD  04/30/2016 3:46 PM    [facility]  This note was generated in part with voice recognition software and I apologize for any typographical errors that were not detected and corrected.

## 2016-05-08 ENCOUNTER — Encounter
Admission: RE | Admit: 2016-05-08 | Discharge: 2016-05-08 | Disposition: A | Discharge status: Home / Self Care | Payer: Medicare Other | Source: Ambulatory Visit | Attending: Cardiology | Admitting: Cardiology

## 2016-05-08 DIAGNOSIS — R079 Chest pain, unspecified: Secondary | ICD-10-CM | POA: Diagnosis not present

## 2016-05-08 DIAGNOSIS — R0602 Shortness of breath: Secondary | ICD-10-CM | POA: Insufficient documentation

## 2016-05-08 DIAGNOSIS — R002 Palpitations: Secondary | ICD-10-CM | POA: Insufficient documentation

## 2016-05-08 LAB — NM MYOCAR MULTI W/SPECT W/WALL MOTION / EF
CHL CUP NUCLEAR SDS: 3
CHL CUP NUCLEAR SSS: 5
CHL CUP RESTING HR STRESS: 60 {beats}/min
CHL CUP STRESS STAGE 1 GRADE: 0 %
CHL CUP STRESS STAGE 1 HR: 61 {beats}/min
CHL CUP STRESS STAGE 1 SPEED: 0 mph
CHL CUP STRESS STAGE 2 SPEED: 0 mph
CHL CUP STRESS STAGE 3 SPEED: 0 mph
CHL CUP STRESS STAGE 4 GRADE: 0 %
CHL CUP STRESS STAGE 4 HR: 76 {beats}/min
CHL CUP STRESS STAGE 4 SPEED: 0 mph
CHL CUP STRESS STAGE 5 HR: 88 {beats}/min
CHL CUP STRESS STAGE 5 SBP: 140 mmHg
CSEPHR: 63 %
CSEPPHR: 68 {beats}/min
CSEPPMHR: 49 %
Estimated workload: 1 METS
LV dias vol: 65 mL (ref 46–106)
LVSYSVOL: 17 mL
SRS: 2
Stage 2 Grade: 0 %
Stage 2 HR: 61 {beats}/min
Stage 3 Grade: 0 %
Stage 3 HR: 68 {beats}/min
Stage 5 DBP: 53 mmHg
Stage 5 Grade: 0 %
Stage 5 Speed: 0 mph
TID: 1.18

## 2016-05-08 MED ORDER — REGADENOSON 0.4 MG/5ML IV SOLN
0.4000 mg | Freq: Once | INTRAVENOUS | Status: AC
  Administered 2016-05-08: 0.4 mg via INTRAVENOUS

## 2016-05-08 MED ORDER — TECHNETIUM TC 99M TETROFOSMIN IV KIT
29.9800 | PACK | Freq: Once | INTRAVENOUS | Status: AC | PRN
  Administered 2016-05-08: 29.98 via INTRAVENOUS

## 2016-05-08 MED ORDER — TECHNETIUM TC 99M TETROFOSMIN IV KIT
12.0000 | PACK | Freq: Once | INTRAVENOUS | Status: AC | PRN
  Administered 2016-05-08: 13.916 via INTRAVENOUS

## 2016-05-13 ENCOUNTER — Ambulatory Visit (INDEPENDENT_AMBULATORY_CARE_PROVIDER_SITE_OTHER): Payer: Medicare Other

## 2016-05-13 DIAGNOSIS — R0602 Shortness of breath: Secondary | ICD-10-CM | POA: Diagnosis not present

## 2016-05-13 DIAGNOSIS — R002 Palpitations: Secondary | ICD-10-CM | POA: Diagnosis not present

## 2016-05-13 DIAGNOSIS — R079 Chest pain, unspecified: Secondary | ICD-10-CM

## 2016-05-26 ENCOUNTER — Encounter: Payer: Self-pay | Admitting: Family Medicine

## 2016-05-28 ENCOUNTER — Other Ambulatory Visit: Payer: Self-pay

## 2016-05-28 ENCOUNTER — Ambulatory Visit (INDEPENDENT_AMBULATORY_CARE_PROVIDER_SITE_OTHER): Payer: Medicare Other

## 2016-05-28 DIAGNOSIS — R079 Chest pain, unspecified: Secondary | ICD-10-CM | POA: Diagnosis not present

## 2016-05-28 DIAGNOSIS — R002 Palpitations: Secondary | ICD-10-CM

## 2016-05-28 DIAGNOSIS — R0602 Shortness of breath: Secondary | ICD-10-CM

## 2016-06-07 ENCOUNTER — Telehealth: Payer: Self-pay | Admitting: Cardiology

## 2016-06-07 NOTE — Telephone Encounter (Signed)
Monitor has not resulted yet.

## 2016-06-07 NOTE — Telephone Encounter (Signed)
Please call Daughter Eunice Blase re monitor results patient has a new phone and doesn't know how to use it or check vm

## 2016-06-10 ENCOUNTER — Other Ambulatory Visit: Payer: Self-pay | Admitting: *Deleted

## 2016-06-10 DIAGNOSIS — Z79899 Other long term (current) drug therapy: Secondary | ICD-10-CM

## 2016-06-10 DIAGNOSIS — I4891 Unspecified atrial fibrillation: Secondary | ICD-10-CM

## 2016-06-10 DIAGNOSIS — I10 Essential (primary) hypertension: Secondary | ICD-10-CM

## 2016-06-10 NOTE — Progress Notes (Signed)
Cardiology Office Note   Date:  06/11/2016   ID:  Kara Rogers, DOB [birth year], MRN [id number]  Referring Doctor:  Nilda Simmer, MD   Cardiologist:   Almond Lint, MD   Reason for consultation:  Chief Complaint  Patient presents with  . other     f/u stress/echo/event monitor. Pt states she is doing well. Reviewed meds with pt verbally.      History of Present Illness: Kara Rogers is a 83 y.o. female who presents forffup after tests  No CP, SOB recently. No complaints of palpitations recently. No syncope. No falls.  ROS:  Please see the history of present illness. Aside from mentioned under HPI, all other systems are reviewed and negative.    Past Medical History:  Diagnosis Date  . Chicken pox   . Essential hypertension, benign   . Hypercalcemia   . Leg cramps   . Measles   . Mumps   . Osteoporosis   . Personal history of colonic polyps   . Pure hypercholesterolemia   . Type II or unspecified type diabetes mellitus without mention of complication, not stated as uncontrolled   . Unspecified disorder of skin and subcutaneous tissue   . Unspecified hypothyroidism   . Unspecified vitamin D deficiency     Past Surgical History:  Procedure Laterality Date  . ABDOMINAL HYSTERECTOMY  1980   R Ovary intact.  DUB.  Marland Kitchen Breast Ductal Resection  02/2010   Left-[place]. Resection of thickened breast duct. Symptoms: L breast drainage hemocult +  . Cataract surgery  02/2011   Left; Right 2009  . CHOLECYSTECTOMY    . HERNIA REPAIR  1981  . NECK SURGERY     L neck cyst resection  . THYROID CYST EXCISION       reports that she has never smoked. She has never used smokeless tobacco. She reports that she does not drink alcohol or use drugs.   family history includes Arthritis in her mother; Cancer in her father and sister; Cirrhosis in her brother; Diabetes in her sister; Osteoporosis in her mother; Seizures in her sister; Stroke in her daughter.   Outpatient  Medications Prior to Visit  Medication Sig Dispense Refill  . alendronate (FOSAMAX) 70 MG tablet TAKE 1 TABLET BY MOUTH  EVERY 7 DAYS WITH A FULL  GLASS OF WATER ON AN EMPTY  STOMACH 12 tablet 3  . amLODipine (NORVASC) 10 MG tablet Take 1 tablet (10 mg total) by mouth daily. 90 tablet 3  . Ascorbic Acid (VITAMIN C) 1000 MG tablet Take 1,000 mg by mouth daily.    . cholecalciferol (VITAMIN D) 1000 UNITS tablet Take 1,000 Units by mouth daily.    Marland Kitchen donepezil (ARICEPT) 10 MG tablet Take 10 mg by mouth at bedtime.     Marland Kitchen glucose blood (ACCU-CHEK AVIVA PLUS) test strip Use to test blood sugar  once daily  Dx: DMII 250.00 100 each 3  . Lancets MISC Test blood sugar daily. Dx code: 250.00 100 each 3  . levothyroxine (SYNTHROID, LEVOTHROID) 50 MCG tablet Take 1 tablet (50 mcg total) by mouth daily. 90 tablet 3  . metFORMIN (GLUCOPHAGE) 500 MG tablet TAKE 1 TABLET BY MOUTH TWO  TIMES DAILY WITH A MEAL 180 tablet 3  . metoprolol succinate (TOPROL-XL) 25 MG 24 hr tablet Take 1 tablet (25 mg total) by mouth daily. 90 tablet 3  . Multiple Vitamin (MULTIVITAMINS PO) Take by mouth.    Marland Kitchen omeprazole (PRILOSEC) 20 MG capsule  TAKE 1 CAPSULE BY MOUTH  DAILY FOR INDIGESTION 90 capsule 0  . oxybutynin (DITROPAN) 5 MG tablet Take 1 tablet (5 mg total) by mouth 3 (three) times daily. 270 tablet 3  . PRESCRIPTION MEDICATION T/S Bayer Contour Test Strips 100    . sertraline (ZOLOFT) 50 MG tablet Take 1 tablet (50 mg total) by mouth daily. 90 tablet 3  . simvastatin (ZOCOR) 20 MG tablet Take 1 tablet (20 mg total) by mouth every evening. 90 tablet 3  . aspirin 81 MG tablet Take 81 mg by mouth daily.    Marland Kitchen losartan-hydrochlorothiazide (HYZAAR) 100-25 MG tablet Take 1 tablet by mouth daily. 90 tablet 3   No facility-administered medications prior to visit.      Allergies: Patient has no known allergies.    PHYSICAL EXAM: VS:  BP (!) 128/54 (BP Location: Left Arm, Patient Position: Sitting, Cuff Size: Normal)   Pulse  65   Ht  (1.6 m)   Wt 186 lb 12 oz (84.7 kg)   BMI 33.08 kg/m  , Body mass index is 33.08 kg/m. Wt Readings from Last 3 Encounters:  06/11/16 186 lb 12 oz (84.7 kg)  04/30/16 181 lb 8 oz (82.3 kg)  02/07/16 184 lb 3.2 oz (83.6 kg)  GENERAL:  well developed, well nourished, obese, not in acute distress HEENT: normocephalic, pink conjunctivae, anicteric sclerae, no xanthelasma, normal dentition, oropharynx clear NECK:  no neck vein engorgement, JVP normal, no hepatojugular reflux, carotid upstroke brisk and symmetric, no bruit, no thyromegaly, no lymphadenopathy LUNGS:  good respiratory effort, clear to auscultation bilaterally CV:  PMI not displaced, no thrills, no lifts, S1 and S2 within normal limits, no palpable S3 or S4, no murmurs, no rubs, no gallops ABD:  Soft, nontender, nondistended, normoactive bowel sounds, no abdominal aortic bruit, no hepatomegaly, no splenomegaly MS: nontender back, no kyphosis, no scoliosis, no joint deformities EXT:  2+ DP/PT pulses, no edema, no varicosities, no cyanosis, no clubbing SKIN: warm, nondiaphoretic, normal turgor, no ulcers NEUROPSYCH: alert, oriented to person, place, and time, sensory/motor grossly intact, normal mood, appropriate affect   Recent Labs: 02/07/2016: TSH 0.600 06/11/2016: ALT 17; BUN 17; Creatinine, Ser 0.71; Hemoglobin 13.3; Platelets 266; Potassium 3.2; Sodium 134   Lipid Panel    Component Value Date/Time   CHOL 155 02/07/2016 1200   TRIG 139 02/07/2016 1200   HDL 57 02/07/2016 1200   CHOLHDL 2.7 02/07/2016 1200   CHOLHDL 2.7 06/20/2015 0949   VLDL 24 06/20/2015 0949   LDLCALC 70 02/07/2016 1200     Other studies Reviewed:  EKG:  The ekg from 04/30/2016 was personally reviewed by me and it revealed sinus rhythm, 61 BPM.  Additional studies/ records that were reviewed personally reviewed by me today include:  Echo 05/28/2016: Left ventricle: The cavity size was normal. There was mild   concentric  hypertrophy. Systolic function was normal. The   estimated ejection fraction was in the range of 55% to 60%. Wall   motion was normal; there were no regional wall motion   abnormalities. Doppler parameters are consistent with abnormal   left ventricular relaxation (grade 1 diastolic dysfunction). - Mitral valve: There was mild regurgitation. - Left atrium: The atrium was mildly dilated. - Pulmonary arteries: Systolic pressure was within the normal   range.  Nuclear stress test 05/08/2016: Pharmacological myocardial perfusion imaging study with no significant  ischemia Normal wall motion, EF estimated at 78% Mild GI uptake artifact noted No EKG changes concerning for ischemia  at peak stress or in recovery. Low risk scan  Long term monitor ordered 05/13/2016:  Overall rhythm was sinus:  47-96 bpm, ave of 63 bpm. 1st degree AV block noted.  No patient triggered events/diary.  1 episode of atrial fibrillation/atrial flutter: 05/23/2016 2:53 am onset, duration of 4h . Heart rate 50-125, ave of 79 bpm.  13 episodes of SVT likely atrial tachycardia: fastest was 4 beats at 146bpm, longest was 7 beats at 107bpm.  Rare isolated PACs, atrial couplets, atrial triplets. Rare isolated PVCs, ventricular couplets, and ventricular triplets. No ventricular tachycardia noted.   ASSESSMENT AND PLAN: Chest pain Shortness of breath with exertion Risk factors for CAD include hypertension, hyperlipidemia, diabetes No ischemia on stresstest, LVEF normal. Discussed findings with patient.   Fluttering in the chest, palpitations Afib/aflutter, Paroxysmal noted on monitor Paroxysmal SVT/atrial tach CHADS2-VASc=4 Discussed stroke risk, stroke risk reduction with OAC, risks/beenfits with OAC. Pt verbalized understanding and would like to start. OAC with Eliquis  bid  Can discontinue ASA. Sleep study recommended. Pt will think about this first.  Hypokalemia rec to discontinue the HCTZ part of  Losartan/ HCTZ. Send in new prescription. Rpt bmp in 1 week. Monitor BP.  Hypertension BP is well controlled. Continue monitoring BP. Continue current medical therapy and lifestyle changes.  Hyperlipidemia Patient on statin therapy, PCP following labs. Ideal LDL goal is less than 70 due to diabetes. Same recommendations as 04/30/2016.   Current medicines are reviewed at length with the patient today.  The patient does not have concerns regarding medicines.  Labs/ tests ordered today include:  Orders Placed This Encounter  Procedures  . Basic metabolic panel   I had a lengthy and detailed discussion with the patient regarding diagnoses, prognosis, diagnostic options, treatment options , and side effects of medications.   I counseled the patient on importance of lifestyle modification including heart healthy diet, regular physical activity.  I spent at least 40 minutes with the patient today and more than 50% of the time was spent counseling the patient and coordinating care.    Disposition:   FU with undersigned in 1 mo   Signed, Almond Lint, MD  06/11/2016 10:29 AM    [facility]  This note was generated in part with voice recognition software and I apologize for any typographical errors that were not detected and corrected.

## 2016-06-11 ENCOUNTER — Other Ambulatory Visit
Admission: RE | Admit: 2016-06-11 | Discharge: 2016-06-11 | Disposition: A | Discharge status: Home / Self Care | Payer: Medicare Other | Source: Ambulatory Visit | Attending: Cardiology | Admitting: Cardiology

## 2016-06-11 ENCOUNTER — Ambulatory Visit (INDEPENDENT_AMBULATORY_CARE_PROVIDER_SITE_OTHER): Payer: Medicare Other | Admitting: Cardiology

## 2016-06-11 ENCOUNTER — Encounter: Payer: Self-pay | Admitting: Cardiology

## 2016-06-11 VITALS — BP 128/54 | HR 65 | Ht 63.0 in | Wt 186.8 lb

## 2016-06-11 DIAGNOSIS — E876 Hypokalemia: Secondary | ICD-10-CM | POA: Diagnosis not present

## 2016-06-11 DIAGNOSIS — R002 Palpitations: Secondary | ICD-10-CM | POA: Diagnosis not present

## 2016-06-11 DIAGNOSIS — I10 Essential (primary) hypertension: Secondary | ICD-10-CM | POA: Insufficient documentation

## 2016-06-11 DIAGNOSIS — I4891 Unspecified atrial fibrillation: Secondary | ICD-10-CM | POA: Diagnosis not present

## 2016-06-11 DIAGNOSIS — E7849 Other hyperlipidemia: Secondary | ICD-10-CM

## 2016-06-11 DIAGNOSIS — E784 Other hyperlipidemia: Secondary | ICD-10-CM

## 2016-06-11 DIAGNOSIS — Z79899 Other long term (current) drug therapy: Secondary | ICD-10-CM | POA: Insufficient documentation

## 2016-06-11 LAB — CBC WITH DIFFERENTIAL/PLATELET
BASOS ABS: 0.1 10*3/uL (ref 0–0.1)
BASOS PCT: 1 %
EOS PCT: 2 %
Eosinophils Absolute: 0.1 10*3/uL (ref 0–0.7)
HCT: 39 % (ref 35.0–47.0)
Hemoglobin: 13.3 g/dL (ref 12.0–16.0)
LYMPHS PCT: 29 %
Lymphs Abs: 2.3 10*3/uL (ref 1.0–3.6)
MCH: 28.5 pg (ref 26.0–34.0)
MCHC: 34.1 g/dL (ref 32.0–36.0)
MCV: 83.4 fL (ref 80.0–100.0)
Monocytes Absolute: 0.6 10*3/uL (ref 0.2–0.9)
Monocytes Relative: 8 %
NEUTROS ABS: 4.9 10*3/uL (ref 1.4–6.5)
Neutrophils Relative %: 60 %
PLATELETS: 266 10*3/uL (ref 150–440)
RBC: 4.67 MIL/uL (ref 3.80–5.20)
RDW: 13.6 % (ref 11.5–14.5)
WBC: 8 10*3/uL (ref 3.6–11.0)

## 2016-06-11 LAB — COMPREHENSIVE METABOLIC PANEL
ALT: 17 U/L (ref 14–54)
ANION GAP: 8 (ref 5–15)
AST: 20 U/L (ref 15–41)
Albumin: 4.2 g/dL (ref 3.5–5.0)
Alkaline Phosphatase: 48 U/L (ref 38–126)
BILIRUBIN TOTAL: 0.9 mg/dL (ref 0.3–1.2)
BUN: 17 mg/dL (ref 6–20)
CO2: 27 mmol/L (ref 22–32)
Calcium: 9.3 mg/dL (ref 8.9–10.3)
Chloride: 99 mmol/L — ABNORMAL LOW (ref 101–111)
Creatinine, Ser: 0.71 mg/dL (ref 0.44–1.00)
GFR calc Af Amer: >60 mL/min (ref >60)
Glucose, Bld: 125 mg/dL — ABNORMAL HIGH (ref 65–99)
POTASSIUM: 3.2 mmol/L — AB (ref 3.5–5.1)
Sodium: 134 mmol/L — ABNORMAL LOW (ref 135–145)
TOTAL PROTEIN: 7.1 g/dL (ref 6.5–8.1)

## 2016-06-11 MED ORDER — LOSARTAN POTASSIUM 100 MG PO TABS
100.0000 mg | ORAL_TABLET | Freq: Every day | ORAL | 3 refills | Status: AC
Start: 2016-06-11 — End: 2016-09-09

## 2016-06-11 MED ORDER — APIXABAN 5 MG PO TABS: 5.0000 mg | ORAL_TABLET | Freq: Two times a day (BID) | ORAL | 6 refills | Status: AC

## 2016-06-11 NOTE — Patient Instructions (Signed)
Medication Instructions:  Your physician has recommended you make the following change in your medication:  1. STOP taking Aspirin 2. STOP taking Losartan-hydrochlorothiazide 3. START Losartan 100 mg Once daily 4. START Eliquis 5 mg twice a day Medication Samples have been provided to the patient.  Drug name: Eliquis       Strength: 5 mg        Qty: 4 boxes LOT: [id number]  Exp.Date: Apr 2020  Dosing instructions: Take 1 tablet twice a day.    Labwork: Your physician recommends that you return for lab work in: 1 week for BMP to be done at [facility] the hospital. Check in at the first desk on the right to register and they will direct you where to go.    Follow-Up: Your physician recommends that you schedule a follow-up appointment in: 1 month.   It was a pleasure seeing you today here in the office. Please do not hesitate to give [place] a call back if you have any further questions. [phone]  [place] Cellar RN, BSN

## 2016-06-20 ENCOUNTER — Other Ambulatory Visit
Admission: RE | Admit: 2016-06-20 | Discharge: 2016-06-20 | Disposition: A | Discharge status: Home / Self Care | Payer: Medicare Other | Source: Ambulatory Visit | Attending: Cardiology | Admitting: Cardiology

## 2016-06-20 DIAGNOSIS — I4891 Unspecified atrial fibrillation: Secondary | ICD-10-CM | POA: Insufficient documentation

## 2016-06-20 LAB — BASIC METABOLIC PANEL
ANION GAP: 7 (ref 5–15)
BUN: 13 mg/dL (ref 6–20)
CHLORIDE: 100 mmol/L — AB (ref 101–111)
CO2: 29 mmol/L (ref 22–32)
Calcium: 9.7 mg/dL (ref 8.9–10.3)
Creatinine, Ser: 0.78 mg/dL (ref 0.44–1.00)
GFR calc non Af Amer: >60 mL/min (ref >60)
Glucose, Bld: 102 mg/dL — ABNORMAL HIGH (ref 65–99)
POTASSIUM: 3.8 mmol/L (ref 3.5–5.1)
SODIUM: 136 mmol/L (ref 135–145)

## 2016-07-11 ENCOUNTER — Encounter: Payer: Self-pay | Admitting: Cardiology

## 2016-07-11 ENCOUNTER — Ambulatory Visit (INDEPENDENT_AMBULATORY_CARE_PROVIDER_SITE_OTHER): Payer: Medicare Other | Admitting: Cardiology

## 2016-07-11 VITALS — BP 134/60 | HR 62 | Ht 63.0 in | Wt 186.0 lb

## 2016-07-11 DIAGNOSIS — E7849 Other hyperlipidemia: Secondary | ICD-10-CM

## 2016-07-11 DIAGNOSIS — E784 Other hyperlipidemia: Secondary | ICD-10-CM | POA: Diagnosis not present

## 2016-07-11 DIAGNOSIS — I4891 Unspecified atrial fibrillation: Secondary | ICD-10-CM | POA: Diagnosis not present

## 2016-07-11 DIAGNOSIS — I10 Essential (primary) hypertension: Secondary | ICD-10-CM | POA: Diagnosis not present

## 2016-07-11 NOTE — Progress Notes (Signed)
Cardiology Office Note   Date:  07/11/2016   ID:  Kara Rogers, DOB 09/14/1931, MRN [id number]  Referring Doctor:  Ethelda ChickSmith, Kristi M, MD   Cardiologist:   Almond LintAileen Matasha Smigelski, MD   Reason for consultation:  Chief Complaint  Patient presents with  . OTHER    1 month f/u no complaints today.  Meds reviewed verbally with pt.      History of Present Illness: Kara Rogers is a 83 y.o. female who presents for ffup for paroxysmal afib  Since his last visit, [facility] No bleeding. No falls. No chest pain or shortness of breath. No recurrence of palpitations.  In 12 likely be moving to [place] sometime in August 2 take care of her daughter who has severe lung disease.  ROS:  Please see the history of present illness. Aside from mentioned under HPI, all other systems are reviewed and negative.    Past Medical History:  Diagnosis Date  . Chicken pox   . Essential hypertension, benign   . Hypercalcemia   . Leg cramps   . Measles   . Mumps   . Osteoporosis   . Personal history of colonic polyps   . Pure hypercholesterolemia   . Type II or unspecified type diabetes mellitus without mention of complication, not stated as uncontrolled   . Unspecified disorder of skin and subcutaneous tissue   . Unspecified hypothyroidism   . Unspecified vitamin D deficiency     Past Surgical History:  Procedure Laterality Date  . ABDOMINAL HYSTERECTOMY  1980   R Ovary intact.  DUB.  Marland Kitchen. Breast Ductal Resection  02/2010   Left-[place]. Resection of thickened breast duct. Symptoms: L breast drainage hemocult +  . Cataract surgery  02/2011   Left; Right 2009  . CHOLECYSTECTOMY    . HERNIA REPAIR  1981  . NECK SURGERY     L neck cyst resection  . THYROID CYST EXCISION       reports that she has never smoked. She has never used smokeless tobacco. She reports that she does not drink alcohol or use drugs.   family history includes Arthritis in her mother; Cancer  in her father and sister; Cirrhosis in her brother; Diabetes in her sister; Osteoporosis in her mother; Seizures in her sister; Stroke in her daughter.   Outpatient Medications Prior to Visit  Medication Sig Dispense Refill  . alendronate (FOSAMAX) 70 MG tablet TAKE 1 TABLET BY MOUTH  EVERY 7 DAYS WITH A FULL  GLASS OF WATER ON AN EMPTY  STOMACH 12 tablet 3  . amLODipine (NORVASC) 10 MG tablet Take 1 tablet (10 mg total) by mouth daily. 90 tablet 3  . apixaban (ELIQUIS) 5 MG TABS tablet Take 1 tablet (5 mg total) by mouth 2 (two) times daily. 60 tablet 6  . Ascorbic Acid (VITAMIN C) 1000 MG tablet Take 1,000 mg by mouth daily.    . cholecalciferol (VITAMIN D) 1000 UNITS tablet Take 1,000 Units by mouth daily.    Marland Kitchen. donepezil (ARICEPT) 10 MG tablet Take 10 mg by mouth at bedtime.     Marland Kitchen. glucose blood (ACCU-CHEK AVIVA PLUS) test strip Use to test blood sugar  once daily  Dx: DMII 250.00 100 each 3  . Lancets MISC Test blood sugar daily. Dx code: 250.00 100 each 3  . levothyroxine (SYNTHROID, LEVOTHROID) 50 MCG tablet Take 1 tablet (50 mcg total) by mouth daily. 90 tablet 3  . losartan (  COZAAR) 100 MG tablet Take 1 tablet (100 mg total) by mouth daily. 90 tablet 3  . metFORMIN (GLUCOPHAGE) 500 MG tablet TAKE 1 TABLET BY MOUTH TWO  TIMES DAILY WITH A MEAL 180 tablet 3  . metoprolol succinate (TOPROL-XL) 25 MG 24 hr tablet Take 1 tablet (25 mg total) by mouth daily. 90 tablet 3  . Multiple Vitamin (MULTIVITAMINS PO) Take by mouth.    Marland Kitchen omeprazole (PRILOSEC) 20 MG capsule TAKE 1 CAPSULE BY MOUTH  DAILY FOR INDIGESTION 90 capsule 0  . oxybutynin (DITROPAN) 5 MG tablet Take 1 tablet (5 mg total) by mouth 3 (three) times daily. 270 tablet 3  . PRESCRIPTION MEDICATION T/S Bayer Contour Test Strips 100    . sertraline (ZOLOFT) 50 MG tablet Take 1 tablet (50 mg total) by mouth daily. 90 tablet 3  . simvastatin (ZOCOR) 20 MG tablet Take 1 tablet (20 mg total) by mouth every evening. 90 tablet 3   No  facility-administered medications prior to visit.      Allergies: Patient has no known allergies.    PHYSICAL EXAM: VS:  BP 134/60 (BP Location: Left Arm, Patient Position: Sitting, Cuff Size: Large)   Pulse 62   Ht 5\' 3"  (1.6 m)   Wt 186 lb (84.4 kg)   BMI 32.95 kg/m  , Body mass index is 32.95 kg/m. Wt Readings from Last 3 Encounters:  07/11/16 186 lb (84.4 kg)  06/11/16 186 lb 12 oz (84.7 kg)  04/30/16 181 lb 8 oz (82.3 kg)  GENERAL:  well developed, well nourished, obese, not in acute distress HEENT: normocephalic, pink conjunctivae, anicteric sclerae, no xanthelasma, normal dentition, oropharynx clear NECK:  no neck vein engorgement, JVP normal, no hepatojugular reflux, carotid upstroke brisk and symmetric, no bruit, no thyromegaly, no lymphadenopathy LUNGS:  good respiratory effort, clear to auscultation bilaterally CV:  PMI not displaced, no thrills, no lifts, S1 and S2 within normal limits, no palpable S3 or S4, no murmurs, no rubs, no gallops ABD:  Soft, nontender, nondistended, normoactive bowel sounds, no abdominal aortic bruit, no hepatomegaly, no splenomegaly MS: nontender back, no kyphosis, no scoliosis, no joint deformities EXT:  2+ DP/PT pulses, no edema, no varicosities, no cyanosis, no clubbing SKIN: warm, nondiaphoretic, normal turgor, no ulcers NEUROPSYCH: alert, oriented to person, place, and time, sensory/motor grossly intact, normal mood, appropriate affect  Recent Labs: 02/07/2016: TSH 0.600 06/11/2016: ALT 17; Hemoglobin 13.3; Platelets 266 06/20/2016: BUN 13; Creatinine, Ser 0.78; Potassium 3.8; Sodium 136   Lipid Panel    Component Value Date/Time   CHOL 155 02/07/2016 1200   TRIG 139 02/07/2016 1200   HDL 57 02/07/2016 1200   CHOLHDL 2.7 02/07/2016 1200   CHOLHDL 2.7 06/20/2015 0949   VLDL 24 06/20/2015 0949   LDLCALC 70 02/07/2016 1200     Other studies Reviewed:  EKG:  The ekg from 04/30/2016 was personally reviewed by me and it revealed  sinus rhythm, 61 BPM.  Additional studies/ records that were reviewed personally reviewed by me today include:  Echo 05/28/2016: Left ventricle: The cavity size was normal. There was mild   concentric hypertrophy. Systolic function was normal. The   estimated ejection fraction was in the range of 55% to 60%. Wall   motion was normal; there were no regional wall motion   abnormalities. Doppler parameters are consistent with abnormal   left ventricular relaxation (grade 1 diastolic dysfunction). - Mitral valve: There was mild regurgitation. - Left atrium: The atrium was mildly dilated. - Pulmonary  arteries: Systolic pressure was within the normal   range.  Nuclear stress test 05/08/2016: Pharmacological myocardial perfusion imaging study with no significant  ischemia Normal wall motion, EF estimated at 78% Mild GI uptake artifact noted No EKG changes concerning for ischemia at peak stress or in recovery. Low risk scan  Long term monitor ordered 05/13/2016:  Overall rhythm was sinus:  47-96 bpm, ave of 63 bpm. 1st degree AV block noted.  No patient triggered events/diary.  1 episode of atrial fibrillation/atrial flutter: 05/23/2016 2:53 am onset, duration of 4h . Heart rate 50-125, ave of 79 bpm.  13 episodes of SVT likely atrial tachycardia: fastest was 4 beats at 146bpm, longest was 7 beats at 107bpm.  Rare isolated PACs, atrial couplets, atrial triplets. Rare isolated PVCs, ventricular couplets, and ventricular triplets. No ventricular tachycardia noted.   ASSESSMENT AND PLAN: Fluttering in the chest, palpitations Afib/aflutter, Paroxysmal noted on monitor Paroxysmal SVT/atrial tach CHADS2-VASc=4 Cont OAC with Eliquis 5mg  bid . Cont Metoprolol.  Sleep study recommended. Pt will think about this first. recffup in 6 months, cmp at that time. Patient will likely be moving to [place] in August and will establish care with cardiologist in the area at that  time.  Hypokalemia Likely related to HCTZ. Potassium was within normal limits on last blood work.  Hypertension BP is well controlled. Continue monitoring BP. Continue current medical therapy and lifestyle changes.   Hyperlipidemia Patient on statin therapy, PCP following labs. Ideal LDL goal is less than 70 due to diabetes. No change in recommendation from 06/11/2016.  Current medicines are reviewed at length with the patient today.  The patient does not have concerns regarding medicines.  Labs/ tests ordered today include:  No orders of the defined types were placed in this encounter.  I had a lengthy and detailed discussion with the patient regarding diagnoses, prognosis, diagnostic options, treatment options , and side effects of medications.   I counseled the patient on importance of lifestyle modification including heart healthy diet, regular physical activity.  Disposition:   FU with cardiology in 6 months  I spent at least 25 minutes with the patient today and more than 50% of the time was spent counseling the patient and coordinating care.     Signed, Almond Lint, MD  07/11/2016 2:58 PM    [facility]  This note was generated in part with voice recognition software and I apologize for any typographical errors that were not detected and corrected.

## 2016-07-11 NOTE — Patient Instructions (Signed)
Follow-Up: Your physician wants you to follow-up in: 6 months. You will receive a reminder letter in the mail two months in advance. If you don't receive a letter, please call our office to schedule the follow-up appointment.  It was a pleasure seeing you today here in the office. Please do not hesitate to give us a call back if you have any further questions. [phone]  Pamela A. RN, BSN     

## 2016-08-07 ENCOUNTER — Encounter: Payer: Self-pay | Admitting: Family Medicine

## 2016-08-07 ENCOUNTER — Ambulatory Visit (INDEPENDENT_AMBULATORY_CARE_PROVIDER_SITE_OTHER): Payer: Medicare Other | Admitting: Family Medicine

## 2016-08-07 VITALS — BP 138/62 | HR 54 | Temp 97.5°F | Resp 18 | Ht 62.21 in | Wt 184.0 lb

## 2016-08-07 DIAGNOSIS — E119 Type 2 diabetes mellitus without complications: Secondary | ICD-10-CM | POA: Diagnosis not present

## 2016-08-07 DIAGNOSIS — F0150 Vascular dementia without behavioral disturbance: Secondary | ICD-10-CM | POA: Diagnosis not present

## 2016-08-07 DIAGNOSIS — N3941 Urge incontinence: Secondary | ICD-10-CM

## 2016-08-07 DIAGNOSIS — E7800 Pure hypercholesterolemia, unspecified: Secondary | ICD-10-CM | POA: Diagnosis not present

## 2016-08-07 DIAGNOSIS — I10 Essential (primary) hypertension: Secondary | ICD-10-CM

## 2016-08-07 DIAGNOSIS — E034 Atrophy of thyroid (acquired): Secondary | ICD-10-CM

## 2016-08-07 DIAGNOSIS — M8589 Other specified disorders of bone density and structure, multiple sites: Secondary | ICD-10-CM

## 2016-08-07 MED ORDER — SERTRALINE HCL 50 MG PO TABS: 50.0000 mg | ORAL_TABLET | Freq: Every day | ORAL | 3 refills | Status: AC

## 2016-08-07 MED ORDER — OXYBUTYNIN CHLORIDE 5 MG PO TABS: 5.0000 mg | ORAL_TABLET | Freq: Three times a day (TID) | ORAL | 3 refills | Status: AC

## 2016-08-07 MED ORDER — SIMVASTATIN 20 MG PO TABS: 20.0000 mg | ORAL_TABLET | Freq: Every evening | ORAL | 3 refills | Status: AC

## 2016-08-07 MED ORDER — GLUCOSE BLOOD VI STRP: ORAL_STRIP | 3 refills | Status: AC

## 2016-08-07 MED ORDER — OMEPRAZOLE 20 MG PO CPDR: DELAYED_RELEASE_CAPSULE | ORAL | 3 refills | Status: AC

## 2016-08-07 MED ORDER — ALENDRONATE SODIUM 70 MG PO TABS: ORAL_TABLET | ORAL | 3 refills | Status: AC

## 2016-08-07 MED ORDER — METFORMIN HCL 500 MG PO TABS: ORAL_TABLET | ORAL | 3 refills | Status: AC

## 2016-08-07 MED ORDER — LEVOTHYROXINE SODIUM 50 MCG PO TABS: 50.0000 ug | ORAL_TABLET | Freq: Every day | ORAL | 3 refills | Status: DC

## 2016-08-07 MED ORDER — AMLODIPINE BESYLATE 5 MG PO TABS: 5.0000 mg | ORAL_TABLET | Freq: Every day | ORAL | 3 refills | Status: DC

## 2016-08-07 MED ORDER — LANCETS MISC: 3 refills | Status: AC

## 2016-08-07 NOTE — Progress Notes (Signed)
Subjective:    Patient ID: Kara RibasBonnie M Rogers, female    DOB: 08/28/1931, 83 y.o.   MRN: [id number]  08/07/2016  Follow-up (6 months DM/HTN)   HPI This 83 y.o. female presents for six month follow-up of DMII, hypertension, hypercholesterolemia, hypothyroidism, urinary incontinence, dementia. Recent dx of atrial fibrillation/flutter and SVT/atrial tachy by cardiology in 05/2016.   Sleep study recommended by cardiology.  S/p follow-up with Dr. Sherryll BurgerShah of neurology; continued Aricept 10mg  daily.  Recommended hurrycane walking cane for ambulation.    Will be moving in with daughter in [place]; has COPD; not improving.  Two daughters have been insisting that patient needs to move to [place].  Pamelia HoitLevi died 2.5 years ago.  Now agreeable to move to [place].  Prayed about it; worried about it; cried about it.  Neighbors husband also worrying about it.  Feel lead to do it.   Moving last of July.  Has lived in the same house for 25 years.  [place]; [place]  Man and four children and 20 great grandchildren. Eliquis added by cardiology.  +urinary frequency; no leakage.  Nocturia 102.  Has Life Line in place.     Immunization History  Administered Date(s) Administered  . Influenza Split 11/18/2011  . Influenza,inj,Quad PF,36+ Mos 01/01/2013, 11/03/2013, 01/03/2015, 02/07/2016  . Influenza-Unspecified 11/26/2008, 11/22/2009, 12/19/2010  . Pneumococcal Conjugate-13 07/21/2013  . Pneumococcal Polysaccharide-23 06/20/2015  . Pneumococcal-Unspecified 02/12/2007  . Tdap 12/19/2010   BP Readings from Last 3 Encounters:  08/07/16 138/62  07/11/16 134/60  06/11/16 (!) 128/54   Wt Readings from Last 3 Encounters:  08/07/16 184 lb (83.5 kg)  07/11/16 186 lb (84.4 kg)  06/11/16 186 lb 12 oz (84.7 kg)    Review of Systems  Constitutional: Negative for chills, diaphoresis, fatigue and fever.  Eyes: Negative for visual disturbance.  Respiratory: Negative for cough and shortness of breath.     Cardiovascular: Negative for chest pain, palpitations and leg swelling.  Gastrointestinal: Negative for abdominal pain, constipation, diarrhea, nausea and vomiting.  Endocrine: Negative for cold intolerance, heat intolerance, polydipsia, polyphagia and polyuria.  Neurological: Negative for dizziness, tremors, seizures, syncope, facial asymmetry, speech difficulty, weakness, light-headedness, numbness and headaches.    Past Medical History:  Diagnosis Date  . Chicken pox   . Essential hypertension, benign   . Hypercalcemia   . Leg cramps   . Measles   . Mumps   . Osteoporosis   . Personal history of colonic polyps   . Pure hypercholesterolemia   . Type II or unspecified type diabetes mellitus without mention of complication, not stated as uncontrolled   . Unspecified disorder of skin and subcutaneous tissue   . Unspecified hypothyroidism   . Unspecified vitamin D deficiency    Past Surgical History:  Procedure Laterality Date  . ABDOMINAL HYSTERECTOMY  1980   R Ovary intact.  DUB.  Marland Kitchen. Breast Ductal Resection  02/2010   Left-[place]. Resection of thickened breast duct. Symptoms: L breast drainage hemocult +  . Cataract surgery  02/2011   Left; Right 2009  . CHOLECYSTECTOMY    . HERNIA REPAIR  1981  . NECK SURGERY     L neck cyst resection  . THYROID CYST EXCISION     No Known Allergies  Social History   Social History  . Marital status: Married    Spouse name: Pamelia HoitLevi  . Number of children: 3  . Years of education: 12   Occupational History  .  retired Retired    07/24/06   Social History Main Topics  . Smoking status: Never Smoker  . Smokeless tobacco: Never Used  . Alcohol use No  . Drug use: No  . Sexual activity: Not Currently   Other Topics Concern  . Not on file   Social History Narrative   Marital status: widowed x 2;  Second husband died in 24-Jul-2014; first husband died 2002-07-24 (41 years of marriage).      [place]      Children:  3  children (52,51,41) son in [place], 2 daughters in [place];  5 grandchildren, 5 gg.        Tobacco: none       Alcohol: none        Exercise: Walking to mailbox daily; no formal exercise program in 07-24-2015.      Living Will:  Patient DOES have Living Will; DNR/DNI.      ADLs:   Independent with all ADL's; drives locally only.  No assistant devices.  +LifeLine.    Uses cane 100%.        Caffeine:  Consumes a minimal amount of caffeine.      Home safety:  Smoke alarms and carbon monoxide detector in the home. +Life Line      Seatbelts:  Always uses seat belts.      Organ donor:  No.       Guns in the home: No unsecured guns in the home.    Family History  Problem Relation Age of Onset  . Cancer Father   . Osteoporosis Mother   . Arthritis Mother        wheelchair bound in 07/24/15  . Seizures Sister        Epilepsy  . Cancer Sister        breast  . Cirrhosis Brother        deceased  . Diabetes Sister        insulin dependent  . Stroke Daughter        Objective:    BP 138/62   Pulse (!) 54   Temp 97.5 F (36.4 C) (Oral)   Resp 18   Ht 5' 2.21" (1.58 m)   Wt 184 lb (83.5 kg)   SpO2 97%   BMI 33.43 kg/m  Physical Exam  Constitutional: She is oriented to person, place, and time. She appears well-developed and well-nourished. No distress.  HENT:  Head: Normocephalic and atraumatic.  Right Ear: External ear normal.  Left Ear: External ear normal.  Nose: Nose normal.  Mouth/Throat: Oropharynx is clear and moist.  Eyes: Conjunctivae and EOM are normal. Pupils are equal, round, and reactive to light.  Neck: Normal range of motion. Neck supple. Carotid bruit is not present. No thyromegaly present.  Cardiovascular: Normal rate, regular rhythm, normal heart sounds and intact distal pulses.  Exam reveals no gallop and no friction rub.   No murmur heard. Pulmonary/Chest: Effort normal and breath sounds normal. She has no wheezes. She has no rales.  Abdominal: Soft. Bowel sounds are  normal. She exhibits no distension and no mass. There is no tenderness. There is no rebound and no guarding.  Lymphadenopathy:    She has no cervical adenopathy.  Neurological: She is alert and oriented to person, place, and time. No cranial nerve deficit.  Skin: Skin is warm and dry. No rash noted. She is not diaphoretic. No erythema. No pallor.  Psychiatric: She has a normal mood and  affect. Her behavior is normal. Judgment and thought content normal.   Results for orders placed or performed during the hospital encounter of 06/20/16  Basic metabolic panel  Result Value Ref Range   Sodium 136 135 - 145 mmol/L   Potassium 3.8 3.5 - 5.1 mmol/L   Chloride 100 (L) 101 - 111 mmol/L   CO2 29 22 - 32 mmol/L   Glucose, Bld 102 (H) 65 - 99 mg/dL   BUN 13 6 - 20 mg/dL   Creatinine, Ser 1.61 0.44 - 1.00 mg/dL   Calcium 9.7 8.9 - 09.6 mg/dL   GFR calc non Af Amer >60 >60 mL/min   GFR calc Af Amer >60 >60 mL/min   Anion gap 7 5 - 15   Depression screen [facility] 2/9 08/07/2016 02/07/2016 06/20/2015 03/06/2015 09/19/2014  Decreased Interest 0 0 0 0 3  Down, Depressed, Hopeless 1 0 0 0 2  PHQ - 2 Score 1 0 0 0 5  Altered sleeping - - - - 1  Tired, decreased energy - - - - 3  Change in appetite - - - - 0  Feeling bad or failure about yourself  - - - - 0  Trouble concentrating - - - - 1  Moving slowly or fidgety/restless - - - - 3  Suicidal thoughts - - - - 0  PHQ-9 Score - - - - 13  Difficult doing work/chores - - - - Very difficult   Fall Risk  08/07/2016 02/07/2016 06/20/2015 09/19/2014 06/22/2014  Falls in the past year? Yes No No Yes Yes  Number falls in past yr: 1 - - 2 or more 2 or more  Injury with Fall? No - - No -       Assessment & Plan:   1. Type 2 diabetes mellitus without complication, without long-term current use of insulin (HCC)   2. Essential hypertension, benign   3. Hypothyroidism due to acquired atrophy of thyroid   4. Vascular dementia without behavioral disturbance   5.  Osteopenia of multiple sites   6. Pure hypercholesterolemia   7. Urinary incontinence, urge    -blood pressure borderline low today and for the past two readings for age; decrease Amlodipine to 5mg  daily.   -blood sugars also under excellent control; decrease Metformin to 500mg  daily. -obtain labs; refills provided in anticipation of upcoming move.   Orders Placed This Encounter  Procedures  . CBC with Differential/Platelet  . Comprehensive metabolic panel    Order Specific Question:   Has the patient fasted?    Answer:   Yes  . Hemoglobin A1c  . Lipid panel    Order Specific Question:   Has the patient fasted?    Answer:   Yes  . T4, free  . TSH   Meds ordered this encounter  Medications  . amLODipine (NORVASC) 5 MG tablet    Sig: Take 1 tablet (5 mg total) by mouth daily.    Dispense:  90 tablet    Refill:  3  . alendronate (FOSAMAX) 70 MG tablet    Sig: TAKE 1 TABLET BY MOUTH  EVERY 7 DAYS WITH A FULL  GLASS OF WATER ON AN EMPTY  STOMACH    Dispense:  12 tablet    Refill:  3  . glucose blood (ACCU-CHEK AVIVA PLUS) test strip    Sig: Use to test blood sugar  once daily  Dx: DMII 250.00    Dispense:  100 each    Refill:  3  .  Lancets MISC    Sig: Test blood sugar daily. Dx code: 250.00    Dispense:  100 each    Refill:  3  . levothyroxine (SYNTHROID, LEVOTHROID) 50 MCG tablet    Sig: Take 1 tablet (50 mcg total) by mouth daily.    Dispense:  90 tablet    Refill:  3  . metFORMIN (GLUCOPHAGE) 500 MG tablet    Sig: TAKE 1 TABLET BY MOUTH ONCE DAILY    Dispense:  90 tablet    Refill:  3  . simvastatin (ZOCOR) 20 MG tablet    Sig: Take 1 tablet (20 mg total) by mouth every evening.    Dispense:  90 tablet    Refill:  3  . sertraline (ZOLOFT) 50 MG tablet    Sig: Take 1 tablet (50 mg total) by mouth daily.    Dispense:  90 tablet    Refill:  3  . oxybutynin (DITROPAN) 5 MG tablet    Sig: Take 1 tablet (5 mg total) by mouth 3 (three) times daily.    Dispense:  270  tablet    Refill:  3  . omeprazole (PRILOSEC) 20 MG capsule    Sig: TAKE 1 CAPSULE BY MOUTH  DAILY FOR INDIGESTION    Dispense:  90 capsule    Refill:  3    Return in about 4 weeks (around 09/04/2016) for recheck BLOOD PRESSURE.   Kristi Paulita Fujita, M.D. Primary Care at [facility] previously [facility] [address] [place], [place]  [address] [phone] phone [phone] fax

## 2016-08-07 NOTE — Patient Instructions (Addendum)
  DECREASING AMLODIPINE FROM 10MG  TO 5 MG ONCE DAILY. (BLOOD PRESSURE) DECREASING METFORMIN 500MG  ONCE DAILY. (DIABETES/SUGARS)    IF you received an x-ray today, you will receive an invoice from [facility]. Please contact [facility] at [phone] with questions or concerns regarding your invoice.   IF you received labwork today, you will receive an invoice from [facility]. Please contact [facility] at [phone] with questions or concerns regarding your invoice.   Our billing staff will not be able to assist you with questions regarding bills from these companies.  You will be contacted with the lab results as soon as they are available. The fastest way to get your results is to activate your My Chart account. Instructions are located on the last page of this paperwork. If you have not heard from us regarding the results in 2 weeks, please contact this office.

## 2016-08-08 LAB — COMPREHENSIVE METABOLIC PANEL
ALBUMIN: 4.5 g/dL (ref 3.5–4.7)
ALT: 13 IU/L (ref 0–32)
AST: 17 IU/L (ref 0–40)
Albumin/Globulin Ratio: 2 (ref 1.2–2.2)
Alkaline Phosphatase: 49 IU/L (ref 39–117)
BUN / CREAT RATIO: 33 — AB (ref 12–28)
BUN: 22 mg/dL (ref 8–27)
Bilirubin Total: 0.6 mg/dL (ref 0.0–1.2)
CALCIUM: 9.8 mg/dL (ref 8.7–10.3)
CO2: 20 mmol/L (ref 18–29)
CREATININE: 0.67 mg/dL (ref 0.57–1.00)
Chloride: 101 mmol/L (ref 96–106)
GFR, EST AFRICAN AMERICAN: 95 mL/min/{1.73_m2} (ref >59)
GFR, EST NON AFRICAN AMERICAN: 82 mL/min/{1.73_m2} (ref >59)
GLOBULIN, TOTAL: 2.2 g/dL (ref 1.5–4.5)
Glucose: 102 mg/dL — ABNORMAL HIGH (ref 65–99)
Potassium: 4.4 mmol/L (ref 3.5–5.2)
SODIUM: 137 mmol/L (ref 134–144)
TOTAL PROTEIN: 6.7 g/dL (ref 6.0–8.5)

## 2016-08-08 LAB — CBC WITH DIFFERENTIAL/PLATELET
BASOS: 0 %
Basophils Absolute: 0 10*3/uL (ref 0.0–0.2)
EOS (ABSOLUTE): 0 10*3/uL (ref 0.0–0.4)
Eos: 0 %
HEMATOCRIT: 40.6 % (ref 34.0–46.6)
HEMOGLOBIN: 13.5 g/dL (ref 11.1–15.9)
Immature Grans (Abs): 0 10*3/uL (ref 0.0–0.1)
Immature Granulocytes: 0 %
LYMPHS ABS: 2.2 10*3/uL (ref 0.7–3.1)
Lymphs: 30 %
MCH: 28.4 pg (ref 26.6–33.0)
MCHC: 33.3 g/dL (ref 31.5–35.7)
MCV: 86 fL (ref 79–97)
MONOCYTES: 6 %
Monocytes Absolute: 0.5 10*3/uL (ref 0.1–0.9)
NEUTROS ABS: 4.8 10*3/uL (ref 1.4–7.0)
Neutrophils: 64 %
Platelets: 278 10*3/uL (ref 150–379)
RBC: 4.75 x10E6/uL (ref 3.77–5.28)
RDW: 14.2 % (ref 12.3–15.4)
WBC: 7.5 10*3/uL (ref 3.4–10.8)

## 2016-08-08 LAB — TSH: TSH: 0.75 u[IU]/mL (ref 0.450–4.500)

## 2016-08-08 LAB — LIPID PANEL
CHOL/HDL RATIO: 2.8 ratio (ref 0.0–4.4)
Cholesterol, Total: 167 mg/dL (ref 100–199)
HDL: 59 mg/dL (ref >39)
LDL Calculated: 80 mg/dL (ref 0–99)
TRIGLYCERIDES: 141 mg/dL (ref 0–149)
VLDL CHOLESTEROL CAL: 28 mg/dL (ref 5–40)

## 2016-08-08 LAB — HEMOGLOBIN A1C
Est. average glucose Bld gHb Est-mCnc: 111 mg/dL
Hgb A1c MFr Bld: 5.5 % (ref 4.8–5.6)

## 2016-08-08 LAB — T4, FREE: FREE T4: 1.84 ng/dL — AB (ref 0.82–1.77)

## 2016-08-26 ENCOUNTER — Encounter: Payer: Self-pay | Admitting: Family Medicine

## 2016-09-17 ENCOUNTER — Encounter: Payer: Self-pay | Admitting: Family Medicine

## 2016-09-17 ENCOUNTER — Ambulatory Visit (INDEPENDENT_AMBULATORY_CARE_PROVIDER_SITE_OTHER): Payer: Medicare Other | Admitting: Family Medicine

## 2016-09-17 VITALS — BP 177/71 | HR 56 | Temp 97.7°F | Resp 18 | Ht 62.21 in | Wt 186.6 lb

## 2016-09-17 DIAGNOSIS — Z1231 Encounter for screening mammogram for malignant neoplasm of breast: Secondary | ICD-10-CM

## 2016-09-17 DIAGNOSIS — I10 Essential (primary) hypertension: Secondary | ICD-10-CM | POA: Diagnosis not present

## 2016-09-17 DIAGNOSIS — E119 Type 2 diabetes mellitus without complications: Secondary | ICD-10-CM | POA: Diagnosis not present

## 2016-09-17 DIAGNOSIS — F4323 Adjustment disorder with mixed anxiety and depressed mood: Secondary | ICD-10-CM | POA: Diagnosis not present

## 2016-09-17 DIAGNOSIS — Z1239 Encounter for other screening for malignant neoplasm of breast: Secondary | ICD-10-CM

## 2016-09-17 DIAGNOSIS — F0150 Vascular dementia without behavioral disturbance: Secondary | ICD-10-CM | POA: Diagnosis not present

## 2016-09-17 LAB — POCT URINALYSIS DIP (MANUAL ENTRY)
BILIRUBIN UA: NEGATIVE mg/dL
Bilirubin, UA: NEGATIVE
Glucose, UA: NEGATIVE mg/dL
Nitrite, UA: POSITIVE — AB
PH UA: 6 (ref 5.0–8.0)
PROTEIN UA: NEGATIVE mg/dL
RBC UA: NEGATIVE
SPEC GRAV UA: 1.01 (ref 1.010–1.025)
Urobilinogen, UA: 0.2 E.U./dL

## 2016-09-17 MED ORDER — LEVOTHYROXINE SODIUM 25 MCG PO TABS: 25.0000 ug | ORAL_TABLET | Freq: Every day | ORAL | 3 refills | Status: AC

## 2016-09-17 MED ORDER — AMLODIPINE BESYLATE 10 MG PO TABS: 10.0000 mg | ORAL_TABLET | Freq: Every day | ORAL | 3 refills | Status: AC

## 2016-09-17 NOTE — Patient Instructions (Addendum)
   RETURN BACK TO AMLODIPINE 10MG  ONE TABLET DAILY (YOU CAN START TAKING AMLODIPINE 5MG  TWO TABLETS DAILY).  DECREASE LEVOTHYROXINE TO 25MCG ONE TABLET DAILY.  I wish you the very best!  You have been a pleasure to take care of.   IF you received an x-ray today, you will receive an invoice from [facility]. Please contact [facility] at [phone] with questions or concerns regarding your invoice.   IF you received labwork today, you will receive an invoice from [facility]. Please contact [facility] at [phone] with questions or concerns regarding your invoice.   Our billing staff will not be able to assist you with questions regarding bills from these companies.  You will be contacted with the lab results as soon as they are available. The fastest way to get your results is to activate your My Chart account. Instructions are located on the last page of this paperwork. If you have not heard from us regarding the results in 2 weeks, please contact this office.

## 2016-09-17 NOTE — Progress Notes (Signed)
Subjective:    Patient ID: Kara Rogers, female    DOB: Aug 30, 1931, 83 y.o.   MRN: [id number]  09/17/2016  Hypertension and Follow-up   HPI This 83 y.o. female presents for evaluation of hypertension.  Decreased Amlodipine to 5mg  daily; also decreased Metformin to 500mg  once daily.   Blood pressure this morning was 162/83.  Sugar/glucose 117.  No longer driving.    BP Readings from Last 3 Encounters:  09/17/16 (!) 177/71  08/07/16 138/62  07/11/16 134/60   Wt Readings from Last 3 Encounters:  09/17/16 186 lb 9.6 oz (84.6 kg)  08/07/16 184 lb (83.5 kg)  07/11/16 186 lb (84.4 kg)   Immunization History  Administered Date(s) Administered  . Influenza Split 11/18/2011  . Influenza,inj,Quad PF,36+ Mos 01/01/2013, 11/03/2013, 01/03/2015, 02/07/2016  . Influenza-Unspecified 11/26/2008, 11/22/2009, 12/19/2010  . Pneumococcal Conjugate-13 07/21/2013  . Pneumococcal Polysaccharide-23 06/20/2015  . Pneumococcal-Unspecified 02/12/2007  . Tdap 12/19/2010    Review of Systems  Constitutional: Negative for chills, diaphoresis, fatigue and fever.  Eyes: Negative for visual disturbance.  Respiratory: Negative for cough and shortness of breath.   Cardiovascular: Negative for chest pain, palpitations and leg swelling.  Gastrointestinal: Negative for abdominal pain, constipation, diarrhea, nausea and vomiting.  Endocrine: Negative for cold intolerance, heat intolerance, polydipsia, polyphagia and polyuria.  Neurological: Negative for dizziness, tremors, seizures, syncope, facial asymmetry, speech difficulty, weakness, light-headedness, numbness and headaches.    Past Medical History:  Diagnosis Date  . Chicken pox   . Essential hypertension, benign   . Hypercalcemia   . Leg cramps   . Measles   . Mumps   . Osteoporosis   . Personal history of colonic polyps   . Pure hypercholesterolemia   . Type II or unspecified type diabetes mellitus without mention of complication, not stated  as uncontrolled   . Unspecified disorder of skin and subcutaneous tissue   . Unspecified hypothyroidism   . Unspecified vitamin D deficiency    Past Surgical History:  Procedure Laterality Date  . ABDOMINAL HYSTERECTOMY  1980   R Ovary intact.  DUB.  Marland Kitchen Breast Ductal Resection  02/2010   Left-[place]. Resection of thickened breast duct. Symptoms: L breast drainage hemocult +  . Cataract surgery  02/2011   Left; Right 2007-07-30  . CHOLECYSTECTOMY    . HERNIA REPAIR  1981  . NECK SURGERY     L neck cyst resection  . THYROID CYST EXCISION     No Known Allergies  Social History   Social History  . Marital status: Married    Spouse name: Pamelia Hoit  . Number of children: 3  . Years of education: 12   Occupational History  . retired Retired    07-30-06   Social History Main Topics  . Smoking status: Never Smoker  . Smokeless tobacco: Never Used  . Alcohol use No  . Drug use: No  . Sexual activity: Not Currently   Other Topics Concern  . Not on file   Social History Narrative   Marital status: widowed x 2;  Second husband died in 30-Jul-2014; first husband died 07/30/2002 (35 years of marriage).      [place]      Children:  3 children (42,51,41) son in [place], 2 daughters in [place];  5 grandchildren, 5 gg.        Tobacco: none       Alcohol: none        Exercise: Walking to [employer]  daily; no formal exercise program in 2017.      Living Will:  Patient DOES have Living Will; DNR/DNI.      ADLs:   Independent with all ADL's; drives locally only.  No assistant devices.  +LifeLine.    Uses cane 100%.        Caffeine:  Consumes a minimal amount of caffeine.      Home safety:  Smoke alarms and carbon monoxide detector in the home. +Life Line      Seatbelts:  Always uses seat belts.      Organ donor:  No.       Guns in the home: No unsecured guns in the home.    Family History  Problem Relation Age of Onset  . Cancer Father   . Osteoporosis Mother   . Arthritis  Mother        wheelchair bound in 2017  . Seizures Sister        Epilepsy  . Cancer Sister        breast  . Cirrhosis Brother        deceased  . Diabetes Sister        insulin dependent  . Stroke Daughter        Objective:    BP (!) 177/71   Pulse (!) 56   Temp 97.7 F (36.5 C) (Oral)   Resp 18   Ht 5' 2.21" (1.58 m)   Wt 186 lb 9.6 oz (84.6 kg)   SpO2 96%   BMI 33.90 kg/m  Physical Exam  Constitutional: She is oriented to person, place, and time. She appears well-developed and well-nourished. No distress.  HENT:  Head: Normocephalic and atraumatic.  Right Ear: External ear normal.  Left Ear: External ear normal.  Nose: Nose normal.  Mouth/Throat: Oropharynx is clear and moist.  Eyes: Pupils are equal, round, and reactive to light. Conjunctivae and EOM are normal.  Neck: Normal range of motion. Neck supple. Carotid bruit is not present. No thyromegaly present.  Cardiovascular: Normal rate, regular rhythm, normal heart sounds and intact distal pulses.  Exam reveals no gallop and no friction rub.   No murmur heard. Pulmonary/Chest: Effort normal and breath sounds normal. She has no wheezes. She has no rales.  Abdominal: Soft. Bowel sounds are normal. She exhibits no distension and no mass. There is no tenderness. There is no rebound and no guarding.  Lymphadenopathy:    She has no cervical adenopathy.  Neurological: She is alert and oriented to person, place, and time. No cranial nerve deficit.  Skin: Skin is warm and dry. No rash noted. She is not diaphoretic. No erythema. No pallor.  Psychiatric: She has a normal mood and affect. Her behavior is normal.   Results for orders placed or performed in visit on 09/17/16  Urine Culture  Result Value Ref Range   Urine Culture, Routine Final report (A)    Organism ID, Bacteria Escherichia coli (A)    Antimicrobial Susceptibility Comment   Microalbumin, urine  Result Value Ref Range   Albumin, Urine 27.8 Not Estab. ug/mL    POCT urinalysis dipstick  Result Value Ref Range   Color, UA yellow yellow   Clarity, UA clear clear   Glucose, UA negative negative mg/dL   Bilirubin, UA negative negative   Ketones, POC UA negative negative mg/dL   Spec Grav, UA 1.610 9.604 - 1.025   Blood, UA negative negative   pH, UA 6.0 5.0 - 8.0   Protein Ur,  POC negative negative mg/dL   Urobilinogen, UA 0.2 0.2 or 1.0 E.U./dL   Nitrite, UA Positive (A) Negative   Leukocytes, UA Small (1+) (A) Negative       Assessment & Plan:   1. Essential hypertension, benign   2. Adjustment disorder with mixed anxiety and depressed mood   3. Type 2 diabetes mellitus without complication, without long-term current use of insulin (HCC)   4. Screening for breast cancer   5. Vascular dementia without behavioral disturbance     -increase Amlodipine back up to 10mg  daily due to elevated blood pressure. -send urine culture with recent memory issues and sense of another person's presence. -decrease Levothyroxine to 25mcg daliy.   Orders Placed This Encounter  Procedures  . Urine Culture  . Microalbumin, urine  . POCT urinalysis dipstick   Meds ordered this encounter  Medications  . amLODipine (NORVASC) 10 MG tablet    Sig: Take 1 tablet (10 mg total) by mouth daily.    Dispense:  90 tablet    Refill:  3  . levothyroxine (SYNTHROID, LEVOTHROID) 25 MCG tablet    Sig: Take 1 tablet (25 mcg total) by mouth daily.    Dispense:  90 tablet    Refill:  3    No Follow-up on file.   Ashad Fawbush Paulita FujitaMartin Kaiea Esselman, M.D. Primary Care at [facility]  [place] previously [facility] [address] [place], [place]  [id number] [phone] phone [phone] fax

## 2016-09-19 LAB — MICROALBUMIN, URINE: Microalbumin, Urine: 27.8 ug/mL

## 2016-09-20 LAB — URINE CULTURE

## 2017-01-05 ENCOUNTER — Other Ambulatory Visit: Payer: Self-pay | Admitting: Physician Assistant

## 2017-01-05 DIAGNOSIS — I10 Essential (primary) hypertension: Secondary | ICD-10-CM

## 2017-02-07 ENCOUNTER — Other Ambulatory Visit: Payer: Self-pay

## 2017-02-07 NOTE — Patient Outreach (Signed)
[facility] Care Management  02/07/2017  Tami RibasBonnie M Rumsey 12/28/1931 [id number]   Medication Adherence call to Mrs. Vidal SchwalbeBonnie Trethewey patient is showing past due under [facility].on Simvastatin 20 mg spoke with Mrs Jackquline BoschHester she needs everything fill from [facility] she ask if we can place an order for her and have [facility]  change her Martie Leeaddress,she has move and is living with daughter in [place] her new address is [address] [place] [phone] gave all this information to [facility] so they can mail all her medication to this new address,patient will received mail order between 5-7 business days.call Mrs. Narula back to let her know when to expect her medication.  Lillia AbedAna Ollison-Moran CPhT Pharmacy Technician [facility] Care Management Direct Dial [phone]  Fax [phone] [email or web address]

## 2017-03-28 ENCOUNTER — Other Ambulatory Visit: Payer: Self-pay | Admitting: Family Medicine

## 2017-03-28 NOTE — Telephone Encounter (Signed)
Last OV 09/17/16.  No future appts noted.  Had 90 tabs with no refills at last OV.  Pharmacy [place] [facility] [place]  Hyzaar request.

## 2017-06-09 ENCOUNTER — Other Ambulatory Visit: Payer: Self-pay | Admitting: Family Medicine

## 2017-06-09 NOTE — Telephone Encounter (Signed)
Patient called [phone], left detailed VM to call the office back to schedule a yearly office visit in order to receive refills on medications. Losartan-Hydrochlorothiazide refilled x 30 days, needs an appointment. It is too early to refill the omeprazole and metformin, both filled last on 08/07/16 90 tab/3 refills.

## 2017-06-11 ENCOUNTER — Other Ambulatory Visit: Payer: Self-pay | Admitting: Family Medicine

## 2017-07-30 ENCOUNTER — Encounter: Payer: Self-pay | Admitting: Family Medicine

## 2018-07-28 ENCOUNTER — Telehealth: Payer: Self-pay

## 2018-07-28 NOTE — Telephone Encounter (Signed)
Called patient from recall.  No answer. LMOV.  This is the second attempt per recall list.   

## 2020-04-13 ENCOUNTER — Telehealth: Payer: Medicare Other | Admitting: Internal Medicine

## 2020-04-13 NOTE — Telephone Encounter (Signed)
Patients Dr.  [place] recall has been called 3x and will now be completed 

## 2021-11-02 DEATH — deceased
